# Patient Record
Sex: Male | Born: 2000 | Race: White | Hispanic: No | Marital: Single | Smoking: Never smoker
Health system: Southern US, Community
[De-identification: ages and names within clinical notes are randomized; demographics above are authoritative.]

## PROBLEM LIST (undated history)

## (undated) DIAGNOSIS — N200 Calculus of kidney: Secondary | ICD-10-CM

## (undated) DIAGNOSIS — R109 Unspecified abdominal pain: Secondary | ICD-10-CM

## (undated) DIAGNOSIS — Z87442 Personal history of urinary calculi: Secondary | ICD-10-CM

## (undated) HISTORY — DX: Unspecified abdominal pain: R10.9

## (undated) HISTORY — PX: TONSILLECTOMY: SUR1361

---
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
## 2021-06-22 ENCOUNTER — Other Ambulatory Visit (HOSPITAL_COMMUNITY): Payer: Self-pay | Admitting: Surgery

## 2021-06-28 ENCOUNTER — Other Ambulatory Visit (HOSPITAL_COMMUNITY): Payer: Self-pay | Admitting: Surgery

## 2021-07-03 ENCOUNTER — Ambulatory Visit (HOSPITAL_COMMUNITY)
Admission: RE | Admit: 2021-07-03 | Discharge: 2021-07-03 | Disposition: A | Discharge status: Home / Self Care | Payer: BC Managed Care – PPO | Source: Ambulatory Visit | Attending: Surgery | Admitting: Surgery

## 2021-07-03 ENCOUNTER — Other Ambulatory Visit: Payer: Self-pay

## 2021-07-26 ENCOUNTER — Encounter: Payer: BC Managed Care – PPO | Attending: Surgery | Admitting: Skilled Nursing Facility1

## 2021-07-26 ENCOUNTER — Encounter: Payer: Self-pay | Admitting: Skilled Nursing Facility1

## 2021-07-26 ENCOUNTER — Other Ambulatory Visit: Payer: Self-pay

## 2021-07-26 DIAGNOSIS — E669 Obesity, unspecified: Secondary | ICD-10-CM | POA: Diagnosis not present

## 2021-07-26 NOTE — Progress Notes (Signed)
Nutrition Assessment for Bariatric Surgery Medical Nutrition Therapy Appt Start Time: 8:10    End Time: 9:10  Patient was seen on 07/26/2021 for Pre-Operative Nutrition Assessment. Letter of approval faxed to [facility] bariatric surgery program coordinator on 07/26/2021.   Referral stated Supervised Weight Loss (SWL) visits needed: 0  Pt completed visits.   Pt has cleared nutrition requirements.   Planned surgery: Sleeve Gastrectomy  Pt expectation of surgery: to lose weight Pt expectation of dietitian: none identified     NUTRITION ASSESSMENT   Anthropometrics  Start weight at [facility]: 311.2 lbs (date: 07/26/2021)  Height: 71 in BMI: 43.40 kg/m2     Clinical  Medical hx: Kidney stones Medications: N/A  Labs: A1C 5.4, LDL 133, triglycerides 193, HDL 38 Notable signs/symptoms: N/A Any previous deficiencies? No  Micronutrient Nutrition Focused Physical Exam: Hair: No issues observed Eyes: No issues observed Mouth: No issues observed Neck: No issues observed Nails: No issues observed Skin: No issues observed  Lifestyle & Dietary Hx  Pt states his parents have had the bariatric surgery and believes them to be successful.   Pt states he does not like any non starchy vegetables.   Pt already has baseline really great habits with his foods, a simple change for him is to add in non starchy vegetables and increase his activity on the weekends.  24-Hr Dietary Recall First Meal: granola bar Snack:  Second Meal: ham sandwich Snack: slim jim  Third Meal: chicken and cheese Snack:  Beverages: water, water +flavorings, half and half tea   Estimated Energy Needs Calories: 2000   NUTRITION DIAGNOSIS  Overweight/obesity (Forman-3.3) related to past poor dietary habits and physical inactivity as evidenced by patient w/ planned sleeve gastrectomy surgery following dietary guidelines for continued weight loss.    NUTRITION INTERVENTION  Nutrition counseling (C-1)  and education (E-2) to facilitate bariatric surgery goals.  Educated pt on micronutrient deficiencies post surgery and strategies to mitigate that risk   Pre-Op Goals Reviewed with the Patient Track food and beverage intake (pen and paper, MyFitness Pal, Baritastic app, etc.) Make healthy food choices while monitoring portion sizes Consume 3 meals per day or try to eat every 3-5 hours Avoid concentrated sugars and fried foods Keep sugar & fat in the single digits per serving on food labels Practice CHEWING your food (aim for applesauce consistency) Practice not drinking 15 minutes before, during, and 30 minutes after each meal and snack Avoid all carbonated beverages (ex: soda, sparkling beverages)  Limit caffeinated beverages (ex: coffee, tea, energy drinks) Avoid all sugar-sweetened beverages (ex: regular soda, sports drinks)  Avoid alcohol  Aim for 64-100 ounces of FLUID daily (with at least half of fluid intake being plain water)  Aim for at least 60-80 grams of PROTEIN daily Look for a liquid protein source that contains ?15 g protein and ?5 g carbohydrate (ex: shakes, drinks, shots) Make a list of non-food related activities Physical activity is an important part of a healthy lifestyle so keep it moving! The goal is to reach 150 minutes of exercise per week, including cardiovascular and weight baring activity.  *Goals that are bolded indicate the pt would like to start working towards these  Handouts Provided Include  Bariatric Surgery handouts (Nutrition Visits, Pre-Op Goals, Protein Shakes, Vitamins & Minerals)  Learning Style & Readiness for Change Teaching method utilized: Visual & Auditory  Demonstrated degree of understanding via: Teach Back  Readiness Level: Action Barriers to learning/adherence to lifestyle change: unknown  MONITORING & EVALUATION Dietary intake, weekly physical activity, body weight, and pre-op goals reached at next nutrition visit.    Next  Steps  Patient is to follow up at [facility] for Pre-Op Class >2 weeks before surgery for further nutrition education.  Pt has completed visits. No further supervised visits required/recomended

## 2021-09-03 ENCOUNTER — Encounter: Payer: BC Managed Care – PPO | Attending: Surgery | Admitting: Skilled Nursing Facility1

## 2021-09-03 DIAGNOSIS — E669 Obesity, unspecified: Secondary | ICD-10-CM | POA: Insufficient documentation

## 2021-09-10 ENCOUNTER — Encounter: Payer: BC Managed Care – PPO | Admitting: Skilled Nursing Facility1

## 2021-09-10 ENCOUNTER — Other Ambulatory Visit: Payer: Self-pay

## 2021-09-10 DIAGNOSIS — E669 Obesity, unspecified: Secondary | ICD-10-CM

## 2021-09-10 NOTE — Progress Notes (Signed)
Pre-Operative Nutrition Class:    Patient was seen on 09/10/2021 for Pre-Operative Bariatric Surgery Education [facility]    Surgery date: 10/16/2021 Surgery type: sleeve Start weight at [facility]: 311 Weight today: 309  Samples given per MNT protocol. Patient educated on appropriate usage:  Ensure max exp: January 16, 2022 Ensure max lot: [id number]  Chewable bariatric advantage: advanced multi EA exp: 08/23 Chewable bariatric advantage: advanced multi EA lot: [id number]  Bariatric advantage calcium citrate exp: 02/23 Bariatric advantage calcium citrate lot: [id number]    The following the learning objectives were met by the patient during this course: Identify Pre-Op Dietary Goals and will begin 2 weeks pre-operatively Identify appropriate sources of fluids and proteins  State protein recommendations and appropriate sources pre and post-operatively Identify Post-Operative Dietary Goals and will follow for 2 weeks post-operatively Identify appropriate multivitamin and calcium sources Describe the need for physical activity post-operatively and will follow MD recommendations State when to call healthcare provider regarding medication questions or post-operative complications When having a diagnosis of diabetes understanding hypoglycemia symptoms and the inclusion of 1 complex carbohydrate per meal  Handouts given during class include: Pre-Op Bariatric Surgery Diet Handout Protein Shake Handout Post-Op Bariatric Surgery Nutrition Handout BELT Program Information Flyer Support Group Information Flyer [facility] Bariatric Supplements Price List  Follow-Up Plan: Patient will follow-up at [facility] 2 weeks post operatively for diet advancement per MD.

## 2021-10-03 ENCOUNTER — Other Ambulatory Visit (HOSPITAL_COMMUNITY): Payer: Self-pay

## 2021-10-05 NOTE — Patient Instructions (Signed)
DUE TO COVID-19 ONLY ONE VISITOR IS ALLOWED TO COME WITH YOU AND STAY IN THE WAITING ROOM ONLY DURING PRE OP AND PROCEDURE DAY OF SURGERY IF YOU ARE GOING HOME AFTER SURGERY. IF YOU ARE SPENDING THE NIGHT 2 PEOPLE MAY VISIT WITH YOU IN YOUR PRIVATE ROOM AFTER SURGERY UNTIL VISITING  HOURS ARE OVER AT 8:00 PM AND 1  VISITOR  MAY  SPEND THE NIGHT.                 Daniel Gregory     Your procedure is scheduled on: 10/16/21   Report to [facility] Main  Entrance   Report to admitting at  11:30 AM     Call this number if you have problems the morning of surgery [phone]   NO SOLID FOOD AFTER 6:00 PM THE NIGHT BEFORE YOUR SURGERY.   YOU MAY DRINK CLEAR FLUIDS UNTIL 11:00 AM  THE G2 DRINK YOU DRINK BEFORE YOU LEAVE HOME WILL BE THE LAST FLUIDS YOU DRINK BEFORE SURGERY.   PAIN IS EXPECTED AFTER SURGERY AND WILL NOT BE COMPLETELY ELIMINATED.   AMBULATION AND TYLENOL WILL HELP REDUCE INCISIONAL AND GAS PAIN. MOVEMENT IS KEY!  YOU ARE EXPECTED TO BE OUT OF BED WITHIN 4 HOURS OF ADMISSION TO YOUR PATIENT ROOM.  SITTING IN THE RECLINER THROUGHOUT THE DAY IS IMPORTANT FOR DRINKING FLUIDS AND MOVING GAS THROUGHOUT THE GI TRACT.  COMPRESSION STOCKINGS SHOULD BE WORN [facility] STAY UNLESS YOU ARE WALKING.   INCENTIVE SPIROMETER SHOULD BE USED EVERY HOUR WHILE AWAKE TO DECREASE POST-OPERATIVE COMPLICATIONS SUCH AS PNEUMONIA.  WHEN DISCHARGED HOME, IT IS IMPORTANT TO CONTINUE TO WALK EVERY HOUR AND USE THE INCENTIVE SPIROMETER EVERY HOUR.    CLEAR LIQUID DIET   Foods Allowed                                                                     Foods Excluded  Coffee and tea, regular and decaf                             liquids that you cannot  Plain Jell-O any favor except red or purple                                           see through such as: Fruit ices (not with fruit pulp)                                     milk, soups, orange juice  Iced Popsicles                                     All solid food Carbonated beverages, regular and diet                                    Cranberry, grape and apple  juices Sports drinks like Gatorade Lightly seasoned clear broth or consume(fat free) Sugar    BRUSH YOUR TEETH MORNING OF SURGERY AND RINSE YOUR MOUTH OUT, NO CHEWING GUM CANDY OR MINTS.     Take these medicines the morning of surgery with A SIP OF WATER: none                                You may not have any metal on your body including              piercings  Do not wear jewelry,lotions, powders or  deodorant             Men may shave face and neck.   Do not bring valuables to the hospital. [facility] IS NOT             RESPONSIBLE   FOR VALUABLES.  Contacts, dentures or bridgework may not be worn into surgery.  Leave suitcase in the car. After surgery it may be brought to your room.                [place] [place] - Preparing for Surgery Before surgery, you can play an important role.  Because skin is not sterile, your skin needs to be as free of germs as possible.  You can reduce the number of germs on your skin by washing with CHG (chlorahexidine gluconate) soap before surgery.  CHG is an antiseptic cleaner which kills germs and bonds with the skin to continue killing germs even after washing. Please DO NOT use if you have an allergy to CHG or antibacterial soaps.  If your skin becomes reddened/irritated stop using the CHG and inform your nurse when you arrive at Short Stay..  You may shave your face/neck. Please follow these instructions carefully:  1.  Shower with CHG Soap the night before surgery and the  morning of Surgery.  2.  If you choose to wash your hair, wash your hair first as usual with your  normal  shampoo.  3.  After you shampoo, rinse your hair and body thoroughly to remove the  shampoo.                            4.  Use CHG as you would any other liquid soap.  You can apply chg directly  to the skin and wash                        Gently with a scrungie or clean washcloth.  5.  Apply the CHG Soap to your body ONLY FROM THE NECK DOWN.   Do not use on face/ open                           Wound or open sores. Avoid contact with eyes, ears mouth and genitals (private parts).                       Wash face,  Genitals (private parts) with your normal soap.             6.  Wash thoroughly, paying special attention to the area where your surgery  will be performed.  7.  Thoroughly rinse your body with warm water from the neck down.  8.  DO NOT shower/wash  with your normal soap after using and rinsing off  the CHG Soap.                9.  Pat yourself dry with a clean towel.            10.  Wear clean pajamas.            11.  Place clean sheets on your bed the night of your first shower and do not  sleep with pets. Day of Surgery : Do not apply any lotions/deodorants the morning of surgery.  Please wear clean clothes to the hospital/surgery center.  FAILURE TO FOLLOW THESE INSTRUCTIONS MAY RESULT IN THE CANCELLATION OF YOUR SURGERY PATIENT SIGNATURE_________________________________  NURSE SIGNATURE__________________________________  ________________________________________________________________________   Daniel Gregory  An incentive spirometer is a tool that can help keep your lungs clear and active. This tool measures how well you are filling your lungs with each breath. Taking long deep breaths may help reverse or decrease the chance of developing breathing (pulmonary) problems (especially infection) following: A long period of time when you are unable to move or be active. BEFORE THE PROCEDURE  If the spirometer includes an indicator to show your best effort, your nurse or respiratory therapist will set it to a desired goal. If possible, sit up straight or lean slightly forward. Try not to slouch. Hold the incentive spirometer in an upright position. INSTRUCTIONS FOR USE  Sit on the edge of your bed  if possible, or sit up as far as you can in bed or on a chair. Hold the incentive spirometer in an upright position. Breathe out normally. Place the mouthpiece in your mouth and seal your lips tightly around it. Breathe in slowly and as deeply as possible, raising the piston or the ball toward the top of the column. Hold your breath for 3-5 seconds or for as long as possible. Allow the piston or ball to fall to the bottom of the column. Remove the mouthpiece from your mouth and breathe out normally. Rest for a few seconds and repeat Steps 1 through 7 at least 10 times every 1-2 hours when you are awake. Take your time and take a few normal breaths between deep breaths. The spirometer may include an indicator to show your best effort. Use the indicator as a goal to work toward during each repetition. After each set of 10 deep breaths, practice coughing to be sure your lungs are clear. If you have an incision (the cut made at the time of surgery), support your incision when coughing by placing a pillow or rolled up towels firmly against it. Once you are able to get out of bed, walk around indoors and cough well. You may stop using the incentive spirometer when instructed by your caregiver.  RISKS AND COMPLICATIONS Take your time so you do not get dizzy or light-headed. If you are in pain, you may need to take or ask for pain medication before doing incentive spirometry. It is harder to take a deep breath if you are having pain. AFTER USE Rest and breathe slowly and easily. It can be helpful to keep track of a log of your progress. Your caregiver can provide you with a simple table to help with this. If you are using the spirometer at home, follow these instructions: SEEK MEDICAL CARE IF:  You are having difficultly using the spirometer. You have trouble using the spirometer as often as instructed. Your pain medication is not giving enough relief  while using the spirometer. You develop fever of  100.5 F (38.1 C) or higher. SEEK IMMEDIATE MEDICAL CARE IF:  You cough up bloody sputum that had not been present before. You develop fever of 102 F (38.9 C) or greater. You develop worsening pain at or near the incision site. MAKE SURE YOU:  Understand these instructions. Will watch your condition. Will get help right away if you are not doing well or get worse. Document Released: 03/17/2007 Document Revised: 01/27/2012 Document Reviewed: 05/18/2007 [facility] Patient Information 2014 [place], [place].   ________________________________________________________________________

## 2021-10-08 ENCOUNTER — Encounter (HOSPITAL_COMMUNITY)
Admission: RE | Admit: 2021-10-08 | Discharge: 2021-10-08 | Disposition: A | Discharge status: Home / Self Care | Payer: BC Managed Care – PPO | Source: Ambulatory Visit | Attending: Surgery | Admitting: Surgery

## 2021-10-08 ENCOUNTER — Encounter (HOSPITAL_COMMUNITY): Payer: Self-pay

## 2021-10-08 ENCOUNTER — Other Ambulatory Visit: Payer: Self-pay

## 2021-10-08 DIAGNOSIS — Z6841 Body Mass Index (BMI) 40.0 and over, adult: Secondary | ICD-10-CM | POA: Insufficient documentation

## 2021-10-08 DIAGNOSIS — Z01812 Encounter for preprocedural laboratory examination: Secondary | ICD-10-CM | POA: Insufficient documentation

## 2021-10-08 HISTORY — DX: Personal history of urinary calculi: Z87.442

## 2021-10-08 LAB — CBC WITH DIFFERENTIAL/PLATELET
Abs Immature Granulocytes: 0.02 10*3/uL (ref 0.00–0.07)
Basophils Absolute: 0 10*3/uL (ref 0.0–0.1)
Basophils Relative: 1 %
Eosinophils Absolute: 0.1 10*3/uL (ref 0.0–0.5)
Eosinophils Relative: 1 %
HCT: 46.6 % (ref 39.0–52.0)
Hemoglobin: 15.6 g/dL (ref 13.0–17.0)
Immature Granulocytes: 0 %
Lymphocytes Relative: 25 %
Lymphs Abs: 1.9 10*3/uL (ref 0.7–4.0)
MCH: 29.1 pg (ref 26.0–34.0)
MCHC: 33.5 g/dL (ref 30.0–36.0)
MCV: 86.8 fL (ref 80.0–100.0)
Monocytes Absolute: 0.7 10*3/uL (ref 0.1–1.0)
Monocytes Relative: 9 %
Neutro Abs: 4.8 10*3/uL (ref 1.7–7.7)
Neutrophils Relative %: 64 %
Platelets: 243 10*3/uL (ref 150–400)
RBC: 5.37 MIL/uL (ref 4.22–5.81)
RDW: 12.6 % (ref 11.5–15.5)
WBC: 7.5 10*3/uL (ref 4.0–10.5)
nRBC: 0 % (ref 0.0–0.2)

## 2021-10-08 LAB — COMPREHENSIVE METABOLIC PANEL
ALT: 24 U/L (ref 0–44)
AST: 24 U/L (ref 15–41)
Albumin: 4.7 g/dL (ref 3.5–5.0)
Alkaline Phosphatase: 60 U/L (ref 38–126)
Anion gap: 8 (ref 5–15)
BUN: 14 mg/dL (ref 6–20)
CO2: 27 mmol/L (ref 22–32)
Calcium: 9.6 mg/dL (ref 8.9–10.3)
Chloride: 103 mmol/L (ref 98–111)
Creatinine, Ser: 0.85 mg/dL (ref 0.61–1.24)
GFR, Estimated: >60 mL/min (ref >60)
Glucose, Bld: 95 mg/dL (ref 70–99)
Potassium: 4.1 mmol/L (ref 3.5–5.1)
Sodium: 138 mmol/L (ref 135–145)
Total Bilirubin: 1.5 mg/dL — ABNORMAL HIGH (ref 0.3–1.2)
Total Protein: 7.8 g/dL (ref 6.5–8.1)

## 2021-10-08 NOTE — Progress Notes (Signed)
COVID test- DOS   PCP - Dr. Rosalita Levan Cardiologist - none  Chest x-ray - 07/03/21-epic EKG - 07/03/21-epic Stress Test - no ECHO - no Cardiac Cath - no Pacemaker/ICD device last checked:NA  Sleep Study - no CPAP -   Fasting Blood Sugar - NA Checks Blood Sugar _____ times a day  Blood Thinner Instructions:NA Aspirin Instructions: Last Dose:  Anesthesia review: no  Patient denies shortness of breath, fever, cough and chest pain at PAT appointment Pt reports no SOB with activities. Pt had MRSA in his Lt knee in 2021  Patient verbalized understanding of instructions that were given to them at the PAT appointment. Patient was also instructed that they will need to review over the PAT instructions again at home before surgery. yes

## 2021-10-13 NOTE — H&P (Signed)
16 October 2021  Chief Complaint: Pre-op Exam (Sleeve)   History of Present Illness: Daniel Gregory is a 20 y.o. male who is seen today for preop for his sleeve gastrectomy scheduled 11/29. His BMI today is 44. I discussed the preop diet 2 weeks beforehand. He seems to be well aware of this. Nothing is happened in terms of his physical exam since last time I saw him..  Review of Systems: See HPI as well for other ROS.  ROS   Medical History: History reviewed. No pertinent past medical history.  Patient Active Problem List  Diagnosis   Morbid obesity with BMI of 40.0-44.9, adult (CMS-HCC)   History reviewed. No pertinent surgical history.   No Known Allergies  No current outpatient medications on file prior to visit.   No current facility-administered medications on file prior to visit.   Family History  Problem Relation Age of Onset   Obesity Mother   Obesity Father   Obesity Sister    Social History   Tobacco Use  Smoking Status Never Smoker  Smokeless Tobacco Never Used    Social History   Socioeconomic History   Marital status: Single  Tobacco Use   Smoking status: Never Smoker   Smokeless tobacco: Never Used  Substance and Sexual Activity   Alcohol use: Not Currently   Drug use: Never   Objective:   Vitals:  08/29/21 1330  Weight: (!) 141.7 kg (312 lb 6.4 oz)  Height: 177.8 cm (5\' 10" )   Body mass index is 44.82 kg/m.  Physical Exam General: Obese white male BMI 44 HEENT: Unremarkable Chest : Clear Heart: Sinus rhythm Breast: Not examined Abdomen: Obese and nontender GU not examined Rectal not performed Extremities full range of motion Neuro alert and oriented x3. Motor and sensory function grossly intact  Labs, Imaging and Diagnostic Testing:  Upper GI series read as normal  Assessment and Plan:  Diagnoses and all orders for this visit:  Morbid (severe) obesity due to excess calories (CMS-HCC)    For robotic assisted sleeve  gastrectomy on November 29. I have placed his orders in epic  No follow-ups on file.  Niomie Englert December 01, MD

## 2021-10-16 ENCOUNTER — Inpatient Hospital Stay (HOSPITAL_COMMUNITY): Payer: BC Managed Care – PPO | Admitting: Certified Registered"

## 2021-10-16 ENCOUNTER — Inpatient Hospital Stay (HOSPITAL_COMMUNITY)
Admission: RE | Admit: 2021-10-16 | Discharge: 2021-10-17 | DRG: 621 | Disposition: A | Discharge status: Home / Self Care | Payer: BC Managed Care – PPO | Attending: Surgery | Admitting: Surgery

## 2021-10-16 ENCOUNTER — Encounter (HOSPITAL_COMMUNITY): Payer: Self-pay | Admitting: Surgery

## 2021-10-16 ENCOUNTER — Encounter (HOSPITAL_COMMUNITY)
Admission: RE | Disposition: A | Discharge status: Home / Self Care | Payer: Self-pay | Source: Home / Self Care | Attending: Surgery

## 2021-10-16 DIAGNOSIS — Z6841 Body Mass Index (BMI) 40.0 and over, adult: Secondary | ICD-10-CM | POA: Diagnosis not present

## 2021-10-16 DIAGNOSIS — Z01818 Encounter for other preprocedural examination: Secondary | ICD-10-CM

## 2021-10-16 DIAGNOSIS — Z9884 Bariatric surgery status: Secondary | ICD-10-CM

## 2021-10-16 DIAGNOSIS — Z20822 Contact with and (suspected) exposure to covid-19: Secondary | ICD-10-CM | POA: Diagnosis present

## 2021-10-16 HISTORY — PX: UPPER GI ENDOSCOPY: SHX6162

## 2021-10-16 LAB — CBC
HCT: 45.4 % (ref 39.0–52.0)
Hemoglobin: 15.2 g/dL (ref 13.0–17.0)
MCH: 29.4 pg (ref 26.0–34.0)
MCHC: 33.5 g/dL (ref 30.0–36.0)
MCV: 87.8 fL (ref 80.0–100.0)
Platelets: 265 10*3/uL (ref 150–400)
RBC: 5.17 MIL/uL (ref 4.22–5.81)
RDW: 12.4 % (ref 11.5–15.5)
WBC: 14.7 10*3/uL — ABNORMAL HIGH (ref 4.0–10.5)
nRBC: 0 % (ref 0.0–0.2)

## 2021-10-16 LAB — ABO/RH: ABO/RH(D): A POS

## 2021-10-16 LAB — TYPE AND SCREEN
ABO/RH(D): A POS
Antibody Screen: NEGATIVE

## 2021-10-16 LAB — SARS CORONAVIRUS 2 BY RT PCR (HOSPITAL ORDER, PERFORMED IN ~~LOC~~ HOSPITAL LAB): SARS Coronavirus 2: NEGATIVE

## 2021-10-16 LAB — CREATININE, SERUM
Creatinine, Ser: 0.98 mg/dL (ref 0.61–1.24)
GFR, Estimated: >60 mL/min (ref >60)

## 2021-10-16 LAB — HEMOGLOBIN AND HEMATOCRIT, BLOOD
HCT: 46.7 % (ref 39.0–52.0)
Hemoglobin: 15.7 g/dL (ref 13.0–17.0)

## 2021-10-16 SURGERY — XI ROBOTIC GASTRIC SLEEVE RESECTION
Anesthesia: General | Site: Esophagus

## 2021-10-16 MED ORDER — ROCURONIUM BROMIDE 10 MG/ML (PF) SYRINGE
PREFILLED_SYRINGE | INTRAVENOUS | Status: DC | PRN
  Administered 2021-10-16: 20 mg via INTRAVENOUS
  Administered 2021-10-16: 50 mg via INTRAVENOUS
  Administered 2021-10-16: 20 mg via INTRAVENOUS

## 2021-10-16 MED ORDER — KETAMINE HCL 10 MG/ML IJ SOLN
INTRAMUSCULAR | Status: AC
  Filled 2021-10-16: qty 1

## 2021-10-16 MED ORDER — OXYCODONE HCL 5 MG/5ML PO SOLN
5.0000 mg | Freq: Four times a day (QID) | ORAL | Status: DC | PRN
  Administered 2021-10-16 – 2021-10-17 (×2): 5 mg via ORAL
  Filled 2021-10-16: qty 5

## 2021-10-16 MED ORDER — PROPOFOL 10 MG/ML IV BOLUS
INTRAVENOUS | Status: AC
  Filled 2021-10-16: qty 20

## 2021-10-16 MED ORDER — ACETAMINOPHEN 500 MG PO TABS
1000.0000 mg | ORAL_TABLET | Freq: Three times a day (TID) | ORAL | Status: DC
  Administered 2021-10-16 – 2021-10-17 (×2): 1000 mg via ORAL
  Filled 2021-10-16: qty 2

## 2021-10-16 MED ORDER — FENTANYL CITRATE PF 50 MCG/ML IJ SOSY
PREFILLED_SYRINGE | INTRAMUSCULAR | Status: AC
  Filled 2021-10-16: qty 2

## 2021-10-16 MED ORDER — MORPHINE SULFATE (PF) 2 MG/ML IV SOLN
1.0000 mg | INTRAVENOUS | Status: DC | PRN
  Administered 2021-10-16 (×2): 2 mg via INTRAVENOUS

## 2021-10-16 MED ORDER — SODIUM CHLORIDE 0.9 % IV SOLN
INTRAVENOUS | Status: AC
  Filled 2021-10-16: qty 2

## 2021-10-16 MED ORDER — LACTATED RINGERS IR SOLN
Status: DC | PRN
  Administered 2021-10-16: 1000 mL

## 2021-10-16 MED ORDER — FENTANYL CITRATE PF 50 MCG/ML IJ SOSY
25.0000 ug | PREFILLED_SYRINGE | INTRAMUSCULAR | Status: DC | PRN
  Administered 2021-10-16 (×2): 50 ug via INTRAVENOUS

## 2021-10-16 MED ORDER — ENSURE MAX PROTEIN PO LIQD
2.0000 [oz_av] | ORAL | Status: DC
  Administered 2021-10-17 (×3): 2 [oz_av] via ORAL

## 2021-10-16 MED ORDER — ACETAMINOPHEN 160 MG/5ML PO SOLN: 1000.0000 mg | Freq: Three times a day (TID) | ORAL | Status: DC

## 2021-10-16 MED ORDER — ACETAMINOPHEN 500 MG PO TABS
ORAL_TABLET | ORAL | Status: AC
  Filled 2021-10-16: qty 2

## 2021-10-16 MED ORDER — DEXAMETHASONE SODIUM PHOSPHATE 10 MG/ML IJ SOLN
INTRAMUSCULAR | Status: DC | PRN
  Administered 2021-10-16: 8 mg via INTRAVENOUS

## 2021-10-16 MED ORDER — PROSOURCE PLUS PO LIQD
ORAL | Status: AC
  Filled 2021-10-16: qty 30

## 2021-10-16 MED ORDER — CHLORHEXIDINE GLUCONATE CLOTH 2 % EX PADS: 6.0000 | MEDICATED_PAD | Freq: Once | CUTANEOUS | Status: DC

## 2021-10-16 MED ORDER — SUGAMMADEX SODIUM 500 MG/5ML IV SOLN
INTRAVENOUS | Status: AC
  Filled 2021-10-16: qty 5

## 2021-10-16 MED ORDER — ONDANSETRON HCL 4 MG/2ML IJ SOLN
4.0000 mg | INTRAMUSCULAR | Status: DC | PRN
  Administered 2021-10-16: 4 mg via INTRAVENOUS

## 2021-10-16 MED ORDER — HEPARIN SODIUM (PORCINE) 5000 UNIT/ML IJ SOLN
5000.0000 [IU] | Freq: Three times a day (TID) | INTRAMUSCULAR | Status: DC
  Administered 2021-10-16 – 2021-10-17 (×4): 5000 [IU] via SUBCUTANEOUS
  Filled 2021-10-16 (×2): qty 1

## 2021-10-16 MED ORDER — ENOXAPARIN (LOVENOX) PATIENT EDUCATION KIT
PACK | Freq: Once | Status: AC
  Filled 2021-10-16: qty 1

## 2021-10-16 MED ORDER — KCL IN DEXTROSE-NACL 20-5-0.45 MEQ/L-%-% IV SOLN: INTRAVENOUS | Status: DC

## 2021-10-16 MED ORDER — LACTATED RINGERS IV SOLN: INTRAVENOUS | Status: DC

## 2021-10-16 MED ORDER — LIDOCAINE HCL 2 % IJ SOLN
INTRAMUSCULAR | Status: AC
  Filled 2021-10-16: qty 20

## 2021-10-16 MED ORDER — APREPITANT 40 MG PO CAPS
ORAL_CAPSULE | ORAL | Status: AC
  Filled 2021-10-16: qty 1

## 2021-10-16 MED ORDER — STERILE WATER FOR IRRIGATION IR SOLN
Status: DC | PRN
  Administered 2021-10-16: 2000 mL

## 2021-10-16 MED ORDER — BUPIVACAINE-EPINEPHRINE (PF) 0.25% -1:200000 IJ SOLN
INTRAMUSCULAR | Status: AC
  Filled 2021-10-16: qty 30

## 2021-10-16 MED ORDER — ACETAMINOPHEN 500 MG PO TABS
1000.0000 mg | ORAL_TABLET | ORAL | Status: AC
  Administered 2021-10-16: 1000 mg via ORAL

## 2021-10-16 MED ORDER — APREPITANT 40 MG PO CAPS
40.0000 mg | ORAL_CAPSULE | ORAL | Status: AC
  Administered 2021-10-16: 40 mg via ORAL

## 2021-10-16 MED ORDER — SODIUM CHLORIDE (PF) 0.9 % IJ SOLN
INTRAMUSCULAR | Status: DC | PRN
  Administered 2021-10-16: 10 mL

## 2021-10-16 MED ORDER — FENTANYL CITRATE (PF) 100 MCG/2ML IJ SOLN
INTRAMUSCULAR | Status: DC | PRN
  Administered 2021-10-16: 100 ug via INTRAVENOUS

## 2021-10-16 MED ORDER — KETAMINE HCL 10 MG/ML IJ SOLN
INTRAMUSCULAR | Status: DC | PRN
  Administered 2021-10-16 (×2): 15 mg via INTRAVENOUS

## 2021-10-16 MED ORDER — FENTANYL CITRATE (PF) 100 MCG/2ML IJ SOLN
INTRAMUSCULAR | Status: AC
  Filled 2021-10-16: qty 2

## 2021-10-16 MED ORDER — MIDAZOLAM HCL 2 MG/2ML IJ SOLN
INTRAMUSCULAR | Status: AC
  Filled 2021-10-16: qty 2

## 2021-10-16 MED ORDER — PROPOFOL 10 MG/ML IV BOLUS
INTRAVENOUS | Status: DC | PRN
  Administered 2021-10-16: 200 mg via INTRAVENOUS

## 2021-10-16 MED ORDER — SIMETHICONE 80 MG PO CHEW
80.0000 mg | CHEWABLE_TABLET | Freq: Four times a day (QID) | ORAL | Status: DC | PRN
  Administered 2021-10-16: 80 mg via ORAL
  Filled 2021-10-16: qty 1

## 2021-10-16 MED ORDER — SCOPOLAMINE 1 MG/3DAYS TD PT72
1.0000 | MEDICATED_PATCH | TRANSDERMAL | Status: DC
  Administered 2021-10-16: 1.5 mg via TRANSDERMAL

## 2021-10-16 MED ORDER — METOPROLOL TARTRATE 5 MG/5ML IV SOLN
5.0000 mg | Freq: Four times a day (QID) | INTRAVENOUS | Status: DC | PRN
  Administered 2021-10-16: 5 mg via INTRAVENOUS
  Filled 2021-10-16: qty 5

## 2021-10-16 MED ORDER — HEPARIN SODIUM (PORCINE) 5000 UNIT/ML IJ SOLN
INTRAMUSCULAR | Status: AC
  Filled 2021-10-16: qty 1

## 2021-10-16 MED ORDER — MIRTAZAPINE 15 MG PO TABS
ORAL_TABLET | ORAL | Status: AC
  Filled 2021-10-16: qty 1

## 2021-10-16 MED ORDER — BUPIVACAINE LIPOSOME 1.3 % IJ SUSP
INTRAMUSCULAR | Status: DC | PRN
  Administered 2021-10-16: 20 mL

## 2021-10-16 MED ORDER — LIDOCAINE 2% (20 MG/ML) 5 ML SYRINGE
INTRAMUSCULAR | Status: DC | PRN
  Administered 2021-10-16: 100 mg via INTRAVENOUS

## 2021-10-16 MED ORDER — MIDAZOLAM HCL 2 MG/2ML IJ SOLN
INTRAMUSCULAR | Status: DC | PRN
  Administered 2021-10-16: 2 mg via INTRAVENOUS

## 2021-10-16 MED ORDER — PANTOPRAZOLE SODIUM 40 MG IV SOLR
40.0000 mg | Freq: Every day | INTRAVENOUS | Status: DC
  Administered 2021-10-16: 40 mg via INTRAVENOUS

## 2021-10-16 MED ORDER — MORPHINE SULFATE (PF) 2 MG/ML IV SOLN
INTRAVENOUS | Status: AC
  Filled 2021-10-16: qty 1

## 2021-10-16 MED ORDER — 0.9 % SODIUM CHLORIDE (POUR BTL) OPTIME
TOPICAL | Status: DC | PRN
  Administered 2021-10-16: 1000 mL

## 2021-10-16 MED ORDER — SUGAMMADEX SODIUM 500 MG/5ML IV SOLN
INTRAVENOUS | Status: DC | PRN
  Administered 2021-10-16: 280 mg via INTRAVENOUS

## 2021-10-16 MED ORDER — CHLORHEXIDINE GLUCONATE 0.12 % MT SOLN: 15.0000 mL | Freq: Once | OROMUCOSAL | Status: AC

## 2021-10-16 MED ORDER — BUPIVACAINE LIPOSOME 1.3 % IJ SUSP
INTRAMUSCULAR | Status: AC
  Filled 2021-10-16: qty 20

## 2021-10-16 MED ORDER — LIDOCAINE HCL (PF) 2 % IJ SOLN
INTRAMUSCULAR | Status: DC | PRN
  Administered 2021-10-16: 1.5 mg/kg/h via INTRADERMAL

## 2021-10-16 MED ORDER — SUCCINYLCHOLINE CHLORIDE 200 MG/10ML IV SOSY
PREFILLED_SYRINGE | INTRAVENOUS | Status: DC | PRN
  Administered 2021-10-16: 140 mg via INTRAVENOUS

## 2021-10-16 MED ORDER — HEPARIN SODIUM (PORCINE) 5000 UNIT/ML IJ SOLN
5000.0000 [IU] | INTRAMUSCULAR | Status: AC
  Administered 2021-10-16: 5000 [IU] via SUBCUTANEOUS

## 2021-10-16 MED ORDER — ONDANSETRON HCL 4 MG/2ML IJ SOLN
INTRAMUSCULAR | Status: DC | PRN
  Administered 2021-10-16: 4 mg via INTRAVENOUS

## 2021-10-16 MED ORDER — SCOPOLAMINE 1 MG/3DAYS TD PT72
MEDICATED_PATCH | TRANSDERMAL | Status: AC
  Filled 2021-10-16: qty 1

## 2021-10-16 MED ORDER — SODIUM CHLORIDE 0.9 % IV SOLN
2.0000 g | INTRAVENOUS | Status: AC
  Administered 2021-10-16: 2 g via INTRAVENOUS

## 2021-10-16 MED ORDER — PHENYLEPHRINE 40 MCG/ML (10ML) SYRINGE FOR IV PUSH (FOR BLOOD PRESSURE SUPPORT)
PREFILLED_SYRINGE | INTRAVENOUS | Status: AC
  Filled 2021-10-16: qty 10

## 2021-10-16 MED ORDER — BUPIVACAINE LIPOSOME 1.3 % IJ SUSP: 20.0000 mL | Freq: Once | INTRAMUSCULAR | Status: DC

## 2021-10-16 MED ORDER — ORAL CARE MOUTH RINSE
15.0000 mL | Freq: Once | OROMUCOSAL | Status: AC
  Administered 2021-10-16: 15 mL via OROMUCOSAL

## 2021-10-16 MED ORDER — PANTOPRAZOLE SODIUM 40 MG IV SOLR
INTRAVENOUS | Status: AC
  Filled 2021-10-16: qty 40

## 2021-10-16 MED ORDER — PHENYLEPHRINE 40 MCG/ML (10ML) SYRINGE FOR IV PUSH (FOR BLOOD PRESSURE SUPPORT)
PREFILLED_SYRINGE | INTRAVENOUS | Status: DC | PRN
  Administered 2021-10-16: 120 ug via INTRAVENOUS
  Administered 2021-10-16: 160 ug via INTRAVENOUS

## 2021-10-16 SURGICAL SUPPLY — 64 items
APPLIER CLIP 5 13 M/L LIGAMAX5 (MISCELLANEOUS)
APPLIER CLIP ROT 10 11.4 M/L (STAPLE)
BLADE SURG 15 STRL LF DISP TIS (BLADE) ×2 IMPLANT
BLADE SURG 15 STRL SS (BLADE) ×1
CANNULA REDUC XI 12-8 STAPL (CANNULA) ×1
CANNULA REDUCER 12-8 DVNC XI (CANNULA) ×2 IMPLANT
CHLORAPREP W/TINT 26 (MISCELLANEOUS) ×3 IMPLANT
CLIP APPLIE 5 13 M/L LIGAMAX5 (MISCELLANEOUS) IMPLANT
CLIP APPLIE ROT 10 11.4 M/L (STAPLE) IMPLANT
COVER SURGICAL LIGHT HANDLE (MISCELLANEOUS) ×3 IMPLANT
DECANTER SPIKE VIAL GLASS SM (MISCELLANEOUS) ×3 IMPLANT
DERMABOND ADVANCED (GAUZE/BANDAGES/DRESSINGS) ×1
DERMABOND ADVANCED .7 DNX12 (GAUZE/BANDAGES/DRESSINGS) ×2 IMPLANT
DRAPE ARM DVNC X/XI (DISPOSABLE) ×8 IMPLANT
DRAPE COLUMN DVNC XI (DISPOSABLE) ×2 IMPLANT
DRAPE DA VINCI XI ARM (DISPOSABLE) ×4
DRAPE DA VINCI XI COLUMN (DISPOSABLE) ×1
ELECT REM PT RETURN 15FT ADLT (MISCELLANEOUS) ×3 IMPLANT
GLOVE SURG ENC MOIS LTX SZ8 (GLOVE) ×6 IMPLANT
GOWN STRL REUS W/TWL XL LVL3 (GOWN DISPOSABLE) ×9 IMPLANT
GRASPER SUT TROCAR 14GX15 (MISCELLANEOUS) ×3 IMPLANT
IRRIG SUCT STRYKERFLOW 2 WTIP (MISCELLANEOUS) ×3
IRRIGATION SUCT STRKRFLW 2 WTP (MISCELLANEOUS) ×2 IMPLANT
KIT BASIN OR (CUSTOM PROCEDURE TRAY) ×3 IMPLANT
KIT TURNOVER KIT A (KITS) IMPLANT
LUBRICANT JELLY K Y 4OZ (MISCELLANEOUS) IMPLANT
MARKER SKIN DUAL TIP RULER LAB (MISCELLANEOUS) ×3 IMPLANT
MAT PREVALON FULL STRYKER (MISCELLANEOUS) ×3 IMPLANT
NEEDLE SPNL 22GX3.5 QUINCKE BK (NEEDLE) ×3 IMPLANT
OBTURATOR OPTICAL STANDARD 8MM (TROCAR) ×1
OBTURATOR OPTICAL STND 8 DVNC (TROCAR) ×2
OBTURATOR OPTICALSTD 8 DVNC (TROCAR) ×2 IMPLANT
PACK CARDIOVASCULAR III (CUSTOM PROCEDURE TRAY) ×3 IMPLANT
RELOAD STAPLER 2.5X60 WHT DVNC (STAPLE) ×8 IMPLANT
RELOAD STAPLER 3.5X60 BLU DVNC (STAPLE) ×4 IMPLANT
SCISSORS LAP 5X35 DISP (ENDOMECHANICALS) IMPLANT
SEAL CANN UNIV 5-8 DVNC XI (MISCELLANEOUS) ×6 IMPLANT
SEAL XI 5MM-8MM UNIVERSAL (MISCELLANEOUS) ×3
SEALER VESSEL DA VINCI XI (MISCELLANEOUS) ×1
SEALER VESSEL EXT DVNC XI (MISCELLANEOUS) ×2 IMPLANT
SLEEVE GASTRECTOMY 36FR VISIGI (MISCELLANEOUS) ×3 IMPLANT
SOL ANTI FOG 6CC (MISCELLANEOUS) ×2 IMPLANT
SOLUTION ANTI FOG 6CC (MISCELLANEOUS) ×1
SOLUTION ELECTROLUBE (MISCELLANEOUS) ×3 IMPLANT
SPONGE T-LAP 18X18 ~~LOC~~+RFID (SPONGE) ×3 IMPLANT
STAPLER 60 DA VINCI SURE FORM (STAPLE) ×1
STAPLER 60 SUREFORM DVNC (STAPLE) ×2 IMPLANT
STAPLER CANNULA SEAL DVNC XI (STAPLE) ×2 IMPLANT
STAPLER CANNULA SEAL XI (STAPLE) ×1
STAPLER RELOAD 2.5X60 WHITE (STAPLE) ×4
STAPLER RELOAD 2.5X60 WHT DVNC (STAPLE) ×8
STAPLER RELOAD 3.5X60 BLU DVNC (STAPLE) ×4
STAPLER RELOAD 3.5X60 BLUE (STAPLE) ×2
SUT ETHIBOND 0 36 GRN (SUTURE) IMPLANT
SUT MNCRL AB 4-0 PS2 18 (SUTURE) ×6 IMPLANT
SUT VICRYL 0 TIES 12 18 (SUTURE) ×3 IMPLANT
SYR 10ML ECCENTRIC (SYRINGE) IMPLANT
SYR 20ML LL LF (SYRINGE) ×3 IMPLANT
TOWEL OR 17X26 10 PK STRL BLUE (TOWEL DISPOSABLE) ×3 IMPLANT
TRAY FOLEY MTR SLVR 16FR STAT (SET/KITS/TRAYS/PACK) IMPLANT
TROCAR ADV FIXATION 5X100MM (TROCAR) IMPLANT
TROCAR BLADELESS OPT 5 100 (ENDOMECHANICALS) ×3 IMPLANT
TUBE CALIBRATION LAPBAND (TUBING) IMPLANT
TUBING INSUFFLATION 10FT LAP (TUBING) ×3 IMPLANT

## 2021-10-16 NOTE — Op Note (Signed)
   Surgeon: Wenda Low, MD, FACS  Asst:  Ivar Drape, MD, FACS 16 October 2021 Anes:  General endotracheal  Procedure: Robotic sleeve gastrectomy and upper endoscopy  Diagnosis: Morbid obesity  Complications: None noted  EBL:   minimal cc  Description of Procedure:  The patient was take to OR 2 and given general anesthesia.  The abdomen was prepped with Chloroprep and draped sterilely.  A timeout was performed.  Access to the abdomen was achieved with a 5 mm Optiview through the left upper quadrant.  Following insufflation, the state of the abdomen was found to be free of adhesions.  The ViSiGi 36Fr tube was inserted to deflate the stomach and was pulled back into the esophagus.  Four trocars were placed including a 12 mm for the robotic stapler.  The robot was docked from the right.  A 5 mm assistants port was placed on the right by Dr. Dossie Der.  The pylorus was identified and we measured 6 cm back and marked the antrum.  At that point we began dissection to take down the greater curvature of the stomach using the vessel sealer.  This dissection was taken all the way up to the left crus.  Posterior attachments of the stomach were also taken down.    The ViSiGi tube was then passed into the antrum and suction applied so that it was snug along the lessor curvature.  The "crow's foot" or incisura was identified.  The sleeve gastrectomy was begun using the Sureform platform stapler beginning with a two blue loads and then white loads.  When the sleeve was complete the tube was taken off suction and insufflated briefly.  The tube was withdrawn.  Upper endoscopy was then performed by Dr. Daphine Deutscher and the sleeve looked good and no bleeding or leaks were detected.     The specimen was extracted through the 12 mm trocar site. PMI was used to close the 12.   Local block was provided by infiltrating abdomen as a TAP block (at the beginning of the case) and then closed 4-0 Monocryl and  Dermabond.    Matt B. Daphine Deutscher, MD, [facility], [place] [phone]

## 2021-10-16 NOTE — Progress Notes (Addendum)
PHARMACY CONSULT FOR:  Risk Assessment for Post-Discharge VTE Following Bariatric Surgery  Post-Discharge VTE Risk Assessment: This patient's probability of 30-day post-discharge VTE is increased due to the factors marked:  X Male    Age >/=60 years    BMI >/=50 kg/m2    CHF    Dyspnea at Rest    Paraplegia   X Non-gastric-band surgery    Operation Time >/=3 hr    Return to OR     Length of Stay >/= 3 d   Hx of VTE   Hypercoagulable condition   Significant venous stasis   Predicted risk of 30-day post-discharge VTE: 0.31% (mild)  Other patient-specific factors to consider:   Recommendation for Discharge: Update: since patient only has 1 risk factor for portomesenteric vein thrombosis, new recommendation is no discharge VTE prophylaxis indicated  Daniel Gregory is a 19 y.o. male who underwent  robotic sleeve gastrectomy and upper endoscopy on 10/16/21   Case start: 1257 Case end: 1426   No Known Allergies  Patient Measurements: Weight: 135.8 kg (299 lb 6.4 oz) Body mass index is 41.76 kg/m.  No results for input(s): WBC, HGB, HCT, PLT, APTT, CREATININE, LABCREA, CREATININE, CREAT24HRUR, MG, PHOS, ALBUMIN, PROT, ALBUMIN, AST, ALT, ALKPHOS, BILITOT, BILIDIR, IBILI in the last 72 hours. Estimated Creatinine Clearance: 193.5 mL/min (by C-G formula based on SCr of 0.85 mg/dL).    Past Medical History:  Diagnosis Date   History of kidney stones      Medications Prior to Admission  Medication Sig Dispense Refill Last Dose   acetaminophen (TYLENOL) 325 MG tablet Take 650 mg by mouth every 6 (six) hours as needed for moderate pain or headache.   Past Week   dicyclomine (BENTYL) 10 MG capsule Take 1 capsule (10 mg total) by mouth 4 (four) times daily -  before meals and at bedtime. (Patient not taking: Reported on 10/02/2021) 30 capsule 0 Not Taking   HYDROcodone-acetaminophen (NORCO) 5-325 MG tablet Take 1 tablet by mouth every 4 (four) hours as needed. (Patient not taking:  Reported on 10/02/2021) 10 tablet 0 Not Taking   lactobacillus acidophilus & bulgar (LACTINEX) chewable tablet Chew 1 tablet by mouth 3 (three) times daily with meals. (Patient not taking: Reported on 10/02/2021) 15 tablet 1 Not Taking   omeprazole (PRILOSEC) 20 MG capsule Take 1 capsule (20 mg total) by mouth daily. (Patient not taking: Reported on 10/02/2021) 30 capsule 0 Not Taking   ondansetron (ZOFRAN) 4 MG tablet Take 1 tablet (4 mg total) by mouth every 6 (six) hours as needed for nausea or vomiting. (Patient not taking: Reported on 10/02/2021) 10 tablet 0 Not Taking    Rexford Maus, PharmD 10/16/2021 2:56 PM

## 2021-10-16 NOTE — Transfer of Care (Signed)
Immediate Anesthesia Transfer of Care Note  Patient: Daniel Gregory  Procedure(s) Performed: XI ROBOTIC GASTRIC SLEEVE RESECTION (Abdomen) UPPER GI ENDOSCOPY (Esophagus)  Patient Location: PACU  Anesthesia Type:General  Level of Consciousness: awake, alert  and oriented  Airway & Oxygen Therapy: Patient Spontanous Breathing and Patient connected to face mask oxygen  Post-op Assessment: Report given to RN, Post -op Vital signs reviewed and stable and Patient moving all extremities X 4  Post vital signs: Reviewed and stable  Last Vitals:  Vitals Value Taken Time  BP 159/89   Temp    Pulse 101 10/16/21 1442  Resp 27 10/16/21 1442  SpO2 100 % 10/16/21 1442  Vitals shown include unvalidated device data.  Last Pain:  Vitals:   10/16/21 1120  TempSrc: Oral         Complications: No notable events documented.

## 2021-10-16 NOTE — Anesthesia Postprocedure Evaluation (Signed)
Anesthesia Post Note  Patient: Daniel Gregory  Procedure(s) Performed: XI ROBOTIC GASTRIC SLEEVE RESECTION (Abdomen) UPPER GI ENDOSCOPY (Esophagus)     Patient location during evaluation: PACU Anesthesia Type: General Level of consciousness: awake Pain management: pain level controlled Vital Signs Assessment: post-procedure vital signs reviewed and stable Respiratory status: spontaneous breathing Cardiovascular status: stable Postop Assessment: no apparent nausea or vomiting Anesthetic complications: no   No notable events documented.  Last Vitals:  Vitals:   10/16/21 1445 10/16/21 1500  BP: (!) 168/95 (!) 166/90  Pulse: 98 99  Resp: (!) 27 13  Temp:    SpO2: 100% 100%    Last Pain:  Vitals:   10/16/21 1442  TempSrc:   PainSc: Asleep                 Josie Burleigh

## 2021-10-16 NOTE — Progress Notes (Signed)
MD Magnus Ivan was paged because patient was complaining of gas pain. Verbal order for PO Simethicone was given.

## 2021-10-16 NOTE — Anesthesia Preprocedure Evaluation (Addendum)
Anesthesia Evaluation  Patient identified by MRN, date of birth, ID band Patient awake    Reviewed: Allergy & Precautions, NPO status , Patient's Chart, lab work & pertinent test results  Airway Mallampati: II  TM Distance: >3 FB     Dental   Pulmonary neg pulmonary ROS,    breath sounds clear to auscultation       Cardiovascular negative cardio ROS   Rhythm:Regular Rate:Normal     Neuro/Psych negative neurological ROS     GI/Hepatic Neg liver ROS, History noted Dr. Chilton Si   Endo/Other  negative endocrine ROS  Renal/GU negative Renal ROS     Musculoskeletal   Abdominal   Peds  Hematology   Anesthesia Other Findings   Reproductive/Obstetrics                             Anesthesia Physical Anesthesia Plan  ASA: 2  Anesthesia Plan: General   Post-op Pain Management:    Induction:   PONV Risk Score and Plan: 3 and Ondansetron, Dexamethasone and Midazolam  Airway Management Planned: Oral ETT  Additional Equipment:   Intra-op Plan:   Post-operative Plan: Possible Post-op intubation/ventilation  Informed Consent: I have reviewed the patients History and Physical, chart, labs and discussed the procedure including the risks, benefits and alternatives for the proposed anesthesia with the patient or authorized representative who has indicated his/her understanding and acceptance.     Dental advisory given  Plan Discussed with: CRNA and Anesthesiologist  Anesthesia Plan Comments:         Anesthesia Quick Evaluation

## 2021-10-16 NOTE — Anesthesia Procedure Notes (Signed)
Procedure Name: Intubation Date/Time: 10/16/2021 12:32 PM Performed by: Niel Hummer, CRNA Pre-anesthesia Checklist: Patient identified, Suction available, Emergency Drugs available and Patient being monitored Patient Re-evaluated:Patient Re-evaluated prior to induction Oxygen Delivery Method: Circle system utilized Preoxygenation: Pre-oxygenation with 100% oxygen Induction Type: IV induction and Rapid sequence Laryngoscope Size: Mac and 4 Grade View: Grade I Tube type: Oral Tube size: 7.5 mm Number of attempts: 1 Airway Equipment and Method: Stylet Placement Confirmation: ETT inserted through vocal cords under direct vision, positive ETCO2 and breath sounds checked- equal and bilateral Secured at: 24 cm Tube secured with: Tape Dental Injury: Teeth and Oropharynx as per pre-operative assessment

## 2021-10-16 NOTE — Interval H&P Note (Signed)
History and Physical Interval Note:  10/16/2021 11:13 AM  Daniel Gregory  has presented today for surgery, with the diagnosis of MORBID OBESITY.  The various methods of treatment have been discussed with the patient and family. After consideration of risks, benefits and other options for treatment, the patient has consented to  Procedure(s): XI ROBOTIC GASTRIC SLEEVE RESECTION (N/A) UPPER GI ENDOSCOPY (N/A) as a surgical intervention.  The patient's history has been reviewed, patient examined, no change in status, stable for surgery.  I have reviewed the patient's chart and labs.  Questions were answered to the patient's satisfaction.     Valarie Merino

## 2021-10-16 NOTE — Progress Notes (Signed)

## 2021-10-17 ENCOUNTER — Encounter (HOSPITAL_COMMUNITY): Payer: Self-pay | Admitting: Surgery

## 2021-10-17 ENCOUNTER — Other Ambulatory Visit (HOSPITAL_COMMUNITY): Payer: Self-pay

## 2021-10-17 LAB — CBC WITH DIFFERENTIAL/PLATELET
Abs Immature Granulocytes: 0.08 10*3/uL — ABNORMAL HIGH (ref 0.00–0.07)
Basophils Absolute: 0 10*3/uL (ref 0.0–0.1)
Basophils Relative: 0 %
Eosinophils Absolute: 0 10*3/uL (ref 0.0–0.5)
Eosinophils Relative: 0 %
HCT: 42.9 % (ref 39.0–52.0)
Hemoglobin: 14.4 g/dL (ref 13.0–17.0)
Immature Granulocytes: 1 %
Lymphocytes Relative: 11 %
Lymphs Abs: 1.3 10*3/uL (ref 0.7–4.0)
MCH: 29.4 pg (ref 26.0–34.0)
MCHC: 33.6 g/dL (ref 30.0–36.0)
MCV: 87.6 fL (ref 80.0–100.0)
Monocytes Absolute: 1.1 10*3/uL — ABNORMAL HIGH (ref 0.1–1.0)
Monocytes Relative: 9 %
Neutro Abs: 9.2 10*3/uL — ABNORMAL HIGH (ref 1.7–7.7)
Neutrophils Relative %: 79 %
Platelets: 271 10*3/uL (ref 150–400)
RBC: 4.9 MIL/uL (ref 4.22–5.81)
RDW: 12.6 % (ref 11.5–15.5)
WBC: 11.7 10*3/uL — ABNORMAL HIGH (ref 4.0–10.5)
nRBC: 0 % (ref 0.0–0.2)

## 2021-10-17 MED ORDER — PANTOPRAZOLE SODIUM 40 MG PO TBEC
40.0000 mg | DELAYED_RELEASE_TABLET | Freq: Every day | ORAL | 0 refills | Status: DC
  Filled 2021-10-17: qty 90, 90d supply, fill #0

## 2021-10-17 MED ORDER — ONDANSETRON 4 MG PO TBDP
4.0000 mg | ORAL_TABLET | Freq: Four times a day (QID) | ORAL | 0 refills | Status: DC | PRN
  Filled 2021-10-17: qty 20, 5d supply, fill #0

## 2021-10-17 MED ORDER — OXYCODONE HCL 5 MG PO TABS
5.0000 mg | ORAL_TABLET | Freq: Four times a day (QID) | ORAL | 0 refills | Status: DC | PRN
  Filled 2021-10-17: qty 10, 3d supply, fill #0

## 2021-10-17 NOTE — Progress Notes (Signed)
Patient alert and oriented, pain is controlled. Patient is tolerating fluids, advanced to protein shake today, patient is tolerating well.  Reviewed Gastric sleeve discharge instructions with patient and patient is able to articulate understanding.  Provided information on BELT program, Support Group and [facility] outpatient pharmacy. All questions answered, will continue to monitor.  

## 2021-10-17 NOTE — Progress Notes (Signed)
Patient alert and oriented, Post op day 1.  Provided support and encouragement.  Encouraged pulmonary toilet, ambulation and small sips of liquids.  All questions answered.  Will continue to monitor. 

## 2021-10-17 NOTE — Discharge Instructions (Signed)

## 2021-10-17 NOTE — Discharge Summary (Signed)
Physician Discharge Summary  Patient ID: Daniel Gregory MRN: [id number] DOB/AGE: [birth year] 21 y.o.  PCP: Gordan Payment., MD  Admit date: 10/16/2021 Discharge date: 10/17/2021  Admission Diagnoses:  morbid obesity  Discharge Diagnoses:  same  Principal Problem:   S/P laparoscopic sleeve gastrectomy   Surgery:  Xi robotic sleeve gastrectomy  Discharged Condition: improved  Hospital Course:   had surgery on Tuesday.  Was begun on clear liquids and advanced.  Ready for discharge on Wednesday.    Consults: none  Significant Diagnostic Studies: none    Discharge Exam: Blood pressure (!) 163/88, pulse 62, temperature 97.9 F (36.6 C), temperature source Oral, resp. rate 16, weight 135.8 kg, SpO2 98 %. Incisions bland  Disposition: Discharge disposition: 01-Home or Self Care       Discharge Instructions     Ambulate hourly while awake   Complete by: As directed    Call MD for:  difficulty breathing, headache or visual disturbances   Complete by: As directed    Call MD for:  persistant dizziness or light-headedness   Complete by: As directed    Call MD for:  persistant nausea and vomiting   Complete by: As directed    Call MD for:  redness, tenderness, or signs of infection (pain, swelling, redness, odor or green/yellow discharge around incision site)   Complete by: As directed    Call MD for:  severe uncontrolled pain   Complete by: As directed    Call MD for:  temperature >101 F   Complete by: As directed    Diet bariatric full liquid   Complete by: As directed    Discharge instructions   Complete by: As directed    Take colace as stool softener;  you may use Miralax daily if needed and if that is not working would use a Dulcolax suppository (over the counter)   Discharge wound care:   Complete by: As directed    You may shower tonight   Incentive spirometry   Complete by: As directed    Perform hourly while awake      Allergies as of 10/17/2021   No  Known Allergies      Medication List     STOP taking these medications    acetaminophen 325 MG tablet Commonly known as: TYLENOL   dicyclomine 10 MG capsule Commonly known as: Bentyl   HYDROcodone-acetaminophen 5-325 MG tablet Commonly known as: Norco   lactobacillus acidophilus & bulgar chewable tablet   omeprazole 20 MG capsule Commonly known as: PRILOSEC   ondansetron 4 MG tablet Commonly known as: ZOFRAN       TAKE these medications    ondansetron 4 MG disintegrating tablet Commonly known as: ZOFRAN-ODT Take 1 tablet (4 mg total) by mouth every 6 (six) hours as needed for nausea or vomiting.   oxyCODONE 5 MG immediate release tablet Commonly known as: Oxy IR/ROXICODONE Take 1 tablet (5 mg total) by mouth every 6 (six) hours as needed for severe pain.   pantoprazole 40 MG tablet Commonly known as: PROTONIX Take 1 tablet (40 mg total) by mouth daily.               Discharge Care Instructions  (From admission, onward)           Start     Ordered   10/17/21 0000  Discharge wound care:       Comments: You may shower tonight   10/17/21 1214  Follow-up Information     Luretha Murphy, MD. Go on 11/07/2021.   Specialty: General Surgery Why: at 10:45am at the [facility] OFFICE.  Please arrive 15 minutes prior to your appointment time.  Thank you. Contact information: [address] [place] [address] [phone]         Surgery, [place]. Go on 12/12/2021.   Specialty: General Surgery Why: at 9:15am with Dr. Daphine Deutscher.  Please arrive 15 minutes prior to the appointment time.  Thank you. Contact information: [address] [place] [address] [phone]                 Signed: Valarie Merino 10/17/2021, 12:15 PM

## 2021-10-17 NOTE — Progress Notes (Signed)
Nurse reviewed discharge instructions with pt.  Pt verbalized understanding of discharge instructions, follow up appointments and new medications.  Medications were delivered to pt prior to discharge.  Pt ambulated off unit with nurse tech at discharge.

## 2021-10-18 LAB — SURGICAL PATHOLOGY

## 2021-10-19 ENCOUNTER — Telehealth (HOSPITAL_COMMUNITY): Payer: Self-pay | Admitting: *Deleted

## 2021-10-19 NOTE — Telephone Encounter (Signed)
1.  Tell me about your pain and pain management? Pt states that pain is getting better.  States that he has only had to take two pain pills since being discharged home.    2.  Let's talk about fluid intake.  How much total fluid are you taking in? Pt states that he is working to meet goal of 64 oz of fluid today.  Pt has consumed two protein shakes and a bottle of water each day.  Pt states that its getting better and is encouraged to continue to work towards meeting goal.  Pt instructed to assess status and suggestions daily utilizing Hydration Action Plan on discharge folder and to call [facility] if in the "red zone".   3.  How much protein have you taken in the last 2 days? Pt states that he is working to meet goal of goal of 80g of protein today.  Pt has consumed  two protein shakes each day since being home.  Discussed with pt that goal of 80g would require at least three shakes per day.  Pt plans to drink remainder of protein throughout the rest of the day to meet goal.  4.  Have you had nausea?  Tell me about when have experienced nausea and what you did to help? Pt denies current nausea.   5.  Has the frequency or color changed with your urine? Pt states that he is urinating "fine" with no changes in frequency or urgency.     6.  Tell me what your incisions look like? "Incisions look fine". Pt denies a fever, chills.  Pt states incisions are not swollen, open, or draining.  Pt encouraged to call [facility] if incisions change.   7.  Have you been passing gas? BM? Pt states that he is having BMs. Last BM 10/19/21.     8.  If a problem or question were to arise who would you call?  Do you know contact numbers for [facility], [facility], and [facility]? Pt denies dehydration symptoms.  Pt can describe s/sx of dehydration.  Pt knows to call [facility] for surgical, [facility] for nutrition, and [facility] for non-urgent questions or concerns.   9.  How has the walking going? Pt states he is walking around and able to be active without  difficulty. Pt states that he is "out and about driving" per instructions of Dr. Daphine Deutscher.  Discussed with pt general rule to wait at least one week and to ensure that he is not taking any medication that would make him drowsy.   10. Are you still using your incentive spirometer?  If so, how often? Pt states that is doing the I.S. some because it is helping him to "get up the mucous from his lungs".  He feels like he's gotten most of it out. Pt encouraged to use incentive spirometer, at least 10x every hour while awake until he sees the surgeon.  11.  How are your vitamins and calcium going?  How are you taking them? Pt states that he is taking his supplements and vitamins without difficulty.  Reminded patient that the first 30 days post-operatively are important for successful recovery.  Practice good hand hygiene, wearing a mask when appropriate (since optional in most places), and minimizing exposure to people who live outside of the home, especially if they are exhibiting any respiratory, GI, or illness-like symptoms.

## 2021-10-30 ENCOUNTER — Encounter: Payer: BC Managed Care – PPO | Attending: Surgery | Admitting: Skilled Nursing Facility1

## 2021-10-30 ENCOUNTER — Other Ambulatory Visit: Payer: Self-pay

## 2021-10-30 DIAGNOSIS — E669 Obesity, unspecified: Secondary | ICD-10-CM | POA: Diagnosis not present

## 2021-10-31 NOTE — Progress Notes (Addendum)
2 Week Post-Operative Nutrition Class   Patient was seen on 10/30/2021 for Post-Operative Nutrition education at the [facility].    Surgery date: 10/16/2021 Surgery type: sleeve Start weight at [facility]: 311 Weight today: pt declined Bowel Habits: Every day to every other day no complaints     The following the learning objectives were met by the patient during this course: Identifies Phase 3 (Soft, High Proteins) Dietary Goals and will begin from 2 weeks post-operatively to 2 months post-operatively Identifies appropriate sources of fluids and proteins  Identifies appropriate fat sources and healthy verses unhealthy fat types   States protein recommendations and appropriate sources post-operatively Identifies the need for appropriate texture modifications, mastication, and bite sizes when consuming solids Identifies appropriate fat consumption and sources Identifies appropriate multivitamin and calcium sources post-operatively Describes the need for physical activity post-operatively and will follow MD recommendations States when to call healthcare provider regarding medication questions or post-operative complications   Handouts given during class include: Phase 3A: Soft, High Protein Diet Handout Phase 3 High Protein Meals Healthy Fats   Follow-Up Plan: Patient will follow-up at [facility] in 6 weeks for 2 month post-op nutrition visit for diet advancement per MD.

## 2021-11-05 ENCOUNTER — Telehealth: Payer: Self-pay | Admitting: Skilled Nursing Facility1

## 2021-11-05 NOTE — Telephone Encounter (Signed)
RD called pt to verify fluid intake once starting soft, solid proteins 2 week post-bariatric surgery.   Daily Fluid intake: 64 oz Daily Protein intake: 80 g Bowel Habits: every other day to every day  Concerns/issues:    None stated

## 2021-12-11 ENCOUNTER — Encounter: Payer: BC Managed Care – PPO | Attending: Surgery | Admitting: Skilled Nursing Facility1

## 2021-12-11 ENCOUNTER — Other Ambulatory Visit: Payer: Self-pay

## 2021-12-11 DIAGNOSIS — E669 Obesity, unspecified: Secondary | ICD-10-CM | POA: Diagnosis present

## 2021-12-11 NOTE — Progress Notes (Signed)
Bariatric Nutrition Follow-Up Visit Medical Nutrition Therapy   NUTRITION ASSESSMENT    Surgery date: 10/16/2021 Surgery type: sleeve Start weight at [facility]: 311 Weight today: 263.2 pounds  Body Composition Scale 12/11/2021  Weight  lbs 263.2  Total Body Fat  % 30.5     Visceral Fat 17  Fat-Free Mass  % 69.4     Total Body Water  % 50.4     Muscle-Mass  lbs 49.6  BMI 36.5  Body Fat Displacement ---        Torso  lbs 49.8        Left Leg  lbs 9.9        Right Leg  lbs 9.9        Left Arm  lbs 4.9        Right Arm  lbs 4.9   Clinical  Medical hx: Kidney stones Medications: N/A  Labs:  Notable signs/symptoms: N/A Any previous deficiencies? No   Lifestyle & Dietary Hx  Pt states from time to time he gets a little tired.  Pt states he plans on going to the gym for 3-4 days a week  Pt states he wonders if 50 pounds is too much too fast to lose and does not wish to lose anymore muscle mass as he saw what happened to his parents.   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    24-Hr Dietary Recall First Meal: eggs + bacon or sausage  Snack:  cheese Second Meal: chicken or hamburger meat Snack:  pepperoni or salami or ham Third Meal: chicken or hamburger meat or BBQ Snack:  Beverages: unsweet tea, water, properl  Post-Op Goals/ Signs/ Symptoms Using straws: no Drinking while eating: no Chewing/swallowing difficulties: no Changes in vision: no Changes to mood/headaches: no Hair loss/changes to skin/nails: no Difficulty focusing/concentrating: no Sweating: no Limb weakness: no Dizziness/lightheadedness: no Palpitations: no  Carbonated/caffeinated beverages: no N/V/D/C/Gas: no Abdominal pain: no Dumping syndrome: no    NUTRITION DIAGNOSIS  Overweight/obesity ([place]-3.3) related to past poor dietary habits and physical inactivity  as evidenced by completed bariatric surgery and following dietary guidelines for continued weight loss and healthy nutrition status.     NUTRITION INTERVENTION Nutrition counseling (C-1) and education (E-2) to facilitate bariatric surgery goals, including: Diet advancement to the next phase (phase 4) now including non starchy vegetables  The importance of consuming adequate calories as well as certain nutrients daily due to the body's need for essential vitamins, minerals, and fats The importance of daily physical activity and to reach a goal of at least 150 minutes of moderate to vigorous physical activity weekly (or as directed by their physician) due to benefits such as increased musculature and improved lab values The importance of intuitive eating specifically learning hunger-satiety cues and understanding the importance of learning a new body: The importance of mindful eating to avoid grazing behaviors  Goals: -add in resistance  -Continue to aim for a minimum of 64 fluid ounces 7 days a week with at least 30 ounces being plain water -Eat non-starchy vegetables 2 times a day 7 days a week -Start out with soft cooked vegetables today and tomorrow; if tolerated begin to eat raw vegetables or cooked including salads -Eat your 3 ounces of protein first then start in on your non-starchy vegetables; once you understand how much of your meal leads to satisfaction and not full while  still eating 3 ounces of protein and non-starchy vegetables you can eat them in any order  -Continue to aim for 30 minutes of activity at least 5 times a week -Do NOT cook with/add to your food: alfredo sauce, cheese sauce, barbeque sauce, ketchup, fat back, butter, bacon grease, grease, Crisco, OR SUGAR -2 non starchy servings per day 7 days a week -2 fruit servings per day  Handouts Provided Include  Phase 4  Learning Style & Readiness for Change Teaching method utilized: Visual & Auditory  Demonstrated degree of  understanding via: Teach Back  Readiness Level: action Barriers to learning/adherence to lifestyle change: none identified  RD's Notes for Next Visit Assess adherence to pt chosen goals     MONITORING & EVALUATION Dietary intake, weekly physical activity, body weight  Next Steps Patient is to follow-up in 2 months

## 2022-01-29 ENCOUNTER — Other Ambulatory Visit: Payer: Self-pay

## 2022-01-29 ENCOUNTER — Encounter: Payer: BC Managed Care – PPO | Attending: Surgery | Admitting: Skilled Nursing Facility1

## 2022-01-29 ENCOUNTER — Other Ambulatory Visit (HOSPITAL_COMMUNITY): Payer: Self-pay

## 2022-01-29 DIAGNOSIS — E669 Obesity, unspecified: Secondary | ICD-10-CM | POA: Diagnosis not present

## 2022-01-29 NOTE — Progress Notes (Signed)
Bariatric Nutrition Follow-Up Visit ?Medical Nutrition Therapy  ? ?NUTRITION ASSESSMENT ?  ? ?Surgery date: 10/16/2021 ?Surgery type: sleeve ?Start weight at [facility]: 311 ?Weight today: 244.6 pounds ? ?Body Composition Scale 12/11/2021 01/29/2022  ?Weight  lbs 263.2 244.6  ?Total Body Fat  % 30.5 27.8  ?   Visceral Fat 17 15  ?Fat-Free Mass  % 69.4 72.1  ?   Total Body Water  % 50.4 53.1  ?   Muscle-Mass  lbs 49.6 46.1  ?BMI 36.5 33.9  ?Body Fat Displacement ---   ?      Torso  lbs 49.8 42.3  ?      Left Leg  lbs 9.9 8.4  ?      Right Leg  lbs 9.9 8.4  ?      Left Arm  lbs 4.9 4.2  ?      Right Arm  lbs 4.9 4.2  ? ?Clinical  ?Medical hx: Kidney stones ?Medications: N/A  ?Labs:  ?Notable signs/symptoms: N/A ?Any previous deficiencies? No ?  ?Lifestyle & Dietary Hx ? ?Pt states his energy is much better than last time since adding in complex carbohydrates.  ?Pt states he is struggling with syncope upon standing.  ? 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 ?24-Hr Dietary Recall ?First Meal: eggs + bacon or sausage  ?Snack:  cup of fruit (2-3 days a week) ?Second Meal: chicken or hamburger meat ?Snack: ?Third Meal: chicken or hamburger meat or BBQ + green beans or broccoli ?Snack:  ?Beverages: unsweet tea, water, propel ? ?Post-Op Goals/ Signs/ Symptoms ?Using straws: no ?Drinking while eating: no ?Chewing/swallowing difficulties: no ?Changes in vision: no ?Changes to mood/headaches: no ?Hair loss/changes to skin/nails: no ?Difficulty focusing/concentrating: no ?Sweating: no ?Limb weakness: no ?Dizziness/lightheadedness: no ?Palpitations: no  ?Carbonated/caffeinated beverages: no ?N/V/D/C/Gas: no ?Abdominal pain: no ?Dumping syndrome: no ? ?  ?NUTRITION DIAGNOSIS  ?Overweight/obesity ([place]-3.3) related to past poor dietary habits and physical inactivity as  evidenced by completed bariatric surgery and following dietary guidelines for continued weight loss and healthy nutrition status. ?  ?  ?NUTRITION INTERVENTION ?Nutrition counseling (C-1) and education (E-2) to facilitate bariatric surgery goals, including: ?The importance of consuming adequate calories as well as certain nutrients daily due to the body's need for essential vitamins, minerals, and fats ?The importance of daily physical activity and to reach a goal of at least 150 minutes of moderate to vigorous physical activity weekly (or as directed by their physician) due to benefits such as increased musculature and improved lab values ?The importance of intuitive eating specifically learning hunger-satiety cues and understanding the importance of learning a new body: The importance of mindful eating to avoid grazing behaviors  ?Encouraged patient to honor their body's internal hunger and fullness cues.  Throughout the day, check in mentally and rate hunger. Stop eating when satisfied not full regardless of how much food is left on the plate.  Get more if still hungry 20-30 minutes later.  The key is to honor satisfaction so throughout the meal, rate fullness factor and stop when comfortably satisfied not physically full. The key is to honor hunger and fullness without any feelings of guilt or shame.  Pay attention to what the internal cues are, rather than any external factors. This will enhance the confidence you have in listening to your own body  and following those internal cues enabling you to increase how often you eat when you are hungry not out of appetite and stop when you are satisfied not full.  ?Encouraged pt to continue to eat balanced meals inclusive of non starchy vegetables 2 times a day 7 days a week ?Encouraged pt to choose lean protein sources: limiting beef, pork, sausage, hotdogs, and lunch meat ?Encourage pt to choose healthy fats such as plant based limiting animal fats ?Encouraged pt to  continue to drink a minium 64 fluid ounces with half being plain water to satisfy proper hydration  ? ?Goals: ?-add in 50 grams of complex carbohydrates per day  ? ?Handouts Provided Include  ?Phase 5 ? ?Learning Style & Readiness for Change ?Teaching method utilized: Visual & Auditory  ?Demonstrated degree of understanding via: Teach Back  ?Readiness Level: action ?Barriers to learning/adherence to lifestyle change: none identified ? ?RD's Notes for Next Visit ?Assess adherence to pt chosen goals ?  ? ?MONITORING & EVALUATION ?Dietary intake, weekly physical activity, body weight ? ?Next Steps ?Patient is to follow-up in 3 months ?

## 2022-02-05 ENCOUNTER — Other Ambulatory Visit (HOSPITAL_COMMUNITY): Payer: Self-pay

## 2022-02-12 IMAGING — RF DG UGI W SINGLE CM
10 of 12 series · 13 of 18 positions shown · non-contrast
Comparison: 01/31/2020 abdominopelvic CT from [REDACTED].
Most recent [HOSPITAL] CT 04/11/2017.

CLINICAL DATA: Preop for bariatric surgery. Morbid obesity. Patient
asymptomatic.

EXAM:
UPPER GI SERIES WITH KUB
TECHNIQUE: After obtaining a scout radiograph a routine upper GI series was
performed using thin barium
FLUOROSCOPY TIME:  Fluoroscopy Time:  3 minutes and 30 seconds
Radiation Exposure Index (if provided by the fluoroscopic device):
Not applicable.
Number of Acquired Spot Images: 0

[Series 1: t abdomen supine · 0.15mm/px · 1 of 1 slices shown]
[im 1/1]
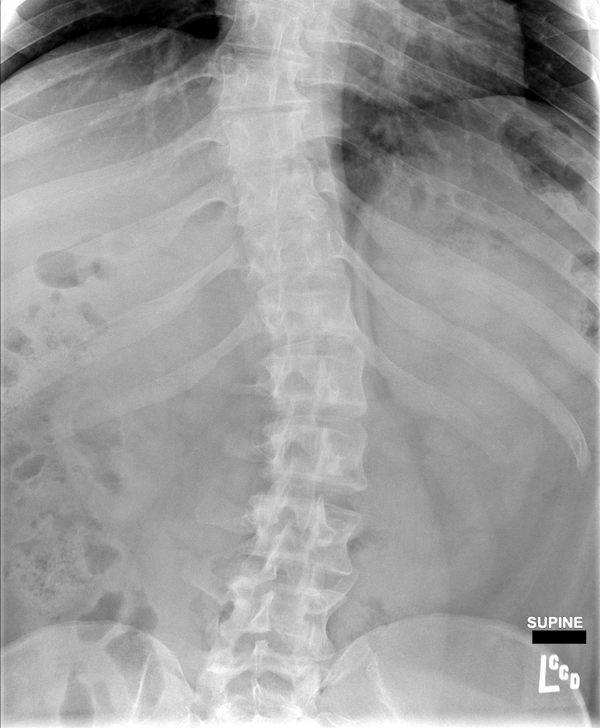

[Series 2: cp_standard · 0.52mm/px · 3 of 156 frames shown (1 of 9)]
[frame 72/156]
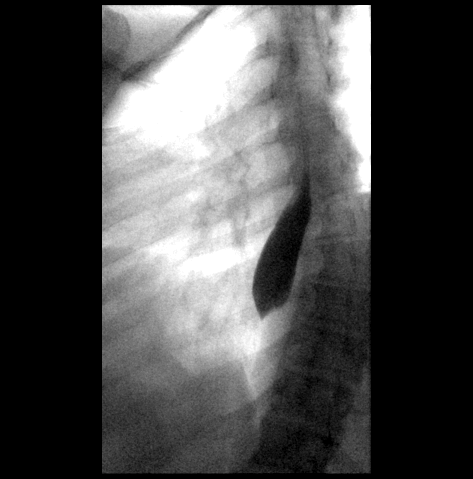
[frame 79/156]
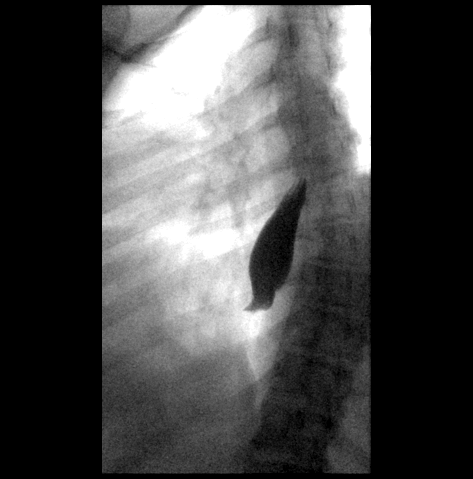
[frame 133/156]
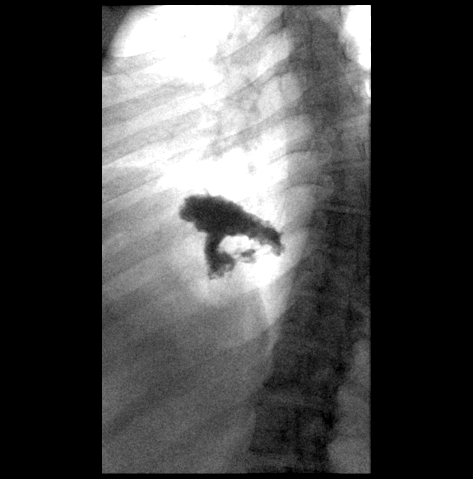

[Series 3: cp_standard · 0.52mm/px · 2 of 497 frames shown (2 of 9)]
[frame 249/497]
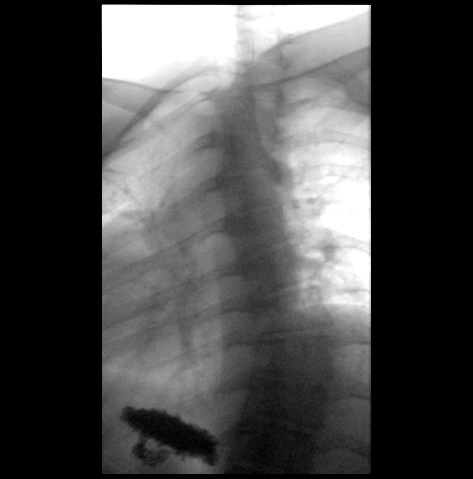
[frame 423/497]
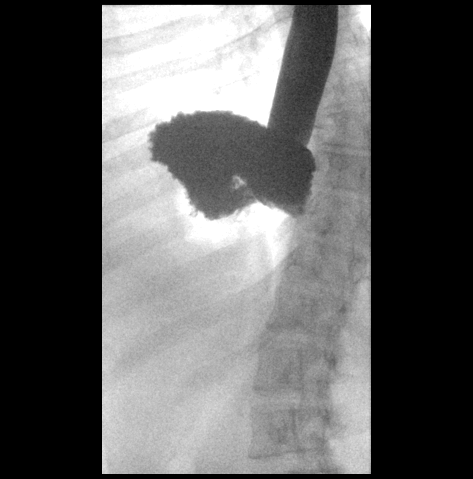

[Series 4: cp_standard · 0.18mm/px · 1 of 1 slices shown (3 of 9)]
[im 1/1]
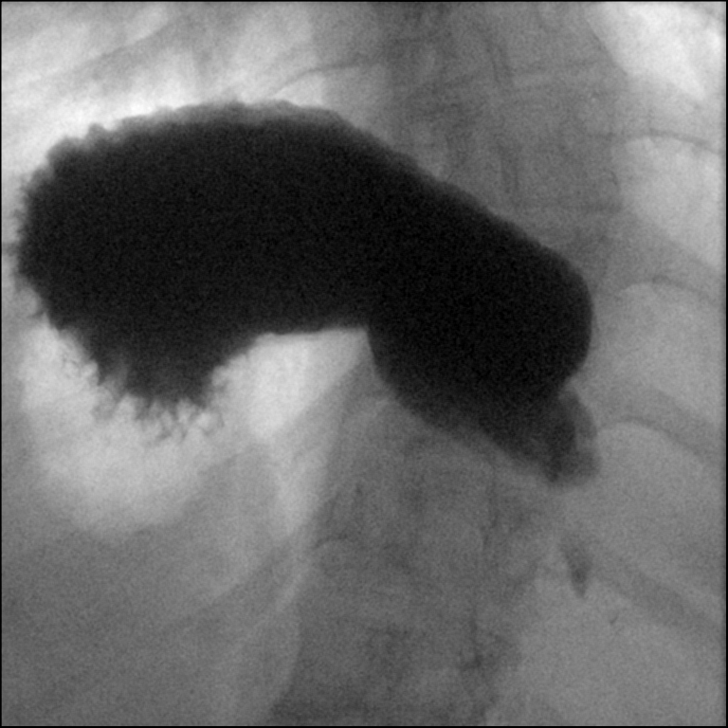

[Series 5: cp_standard · 0.26mm/px · 1 of 1 slices shown (4 of 9)]
[im 1/1]
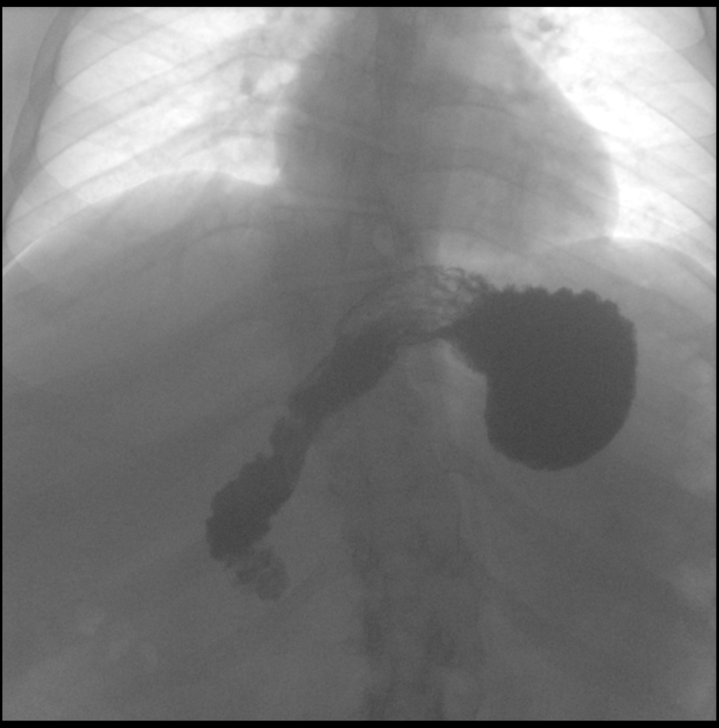

[Series 6: cp_standard · 0.26mm/px · 1 of 1 slices shown (5 of 9)]
[im 1/1]
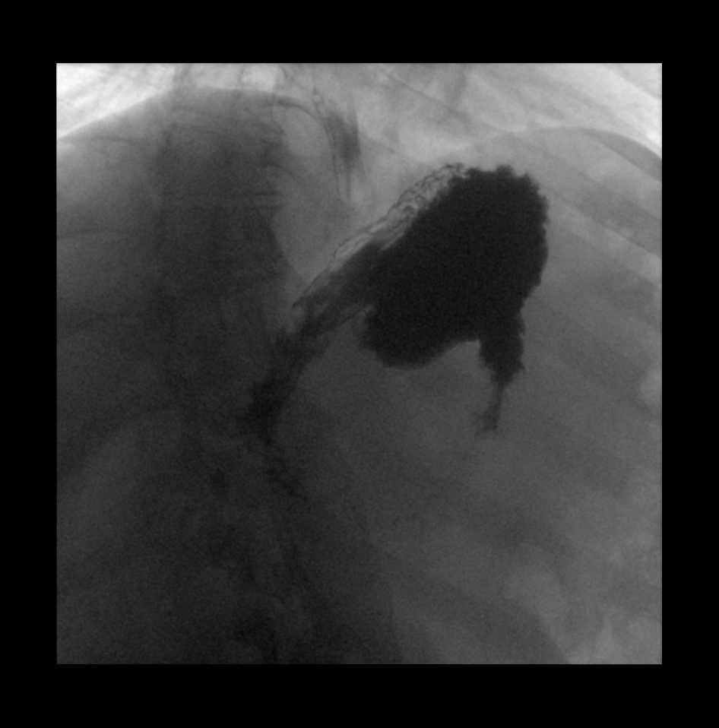

[Series 8: cp_standard · 0.26mm/px · 1 of 1 slices shown (6 of 9)]
[im 1/1]
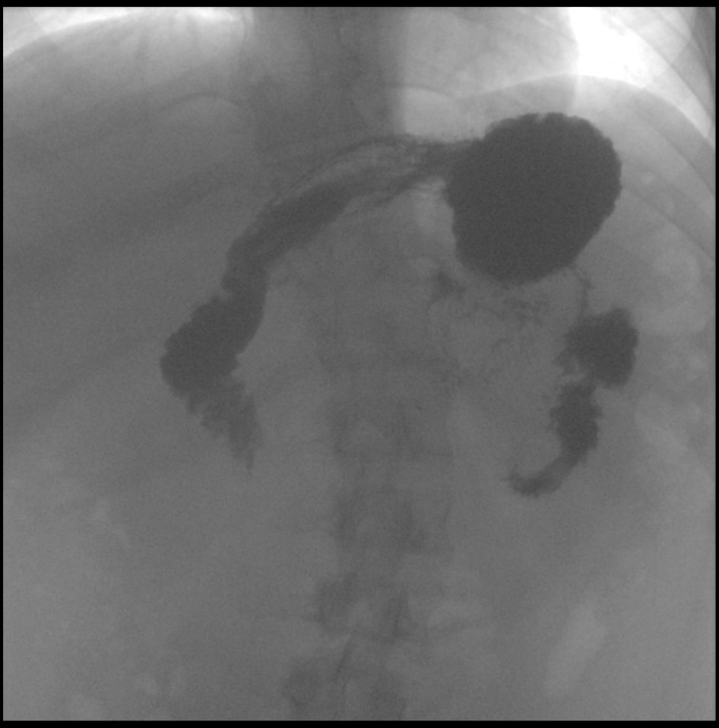

[Series 9: cp_standard · 0.26mm/px · 1 of 1 slices shown (7 of 9)]
[im 1/1]
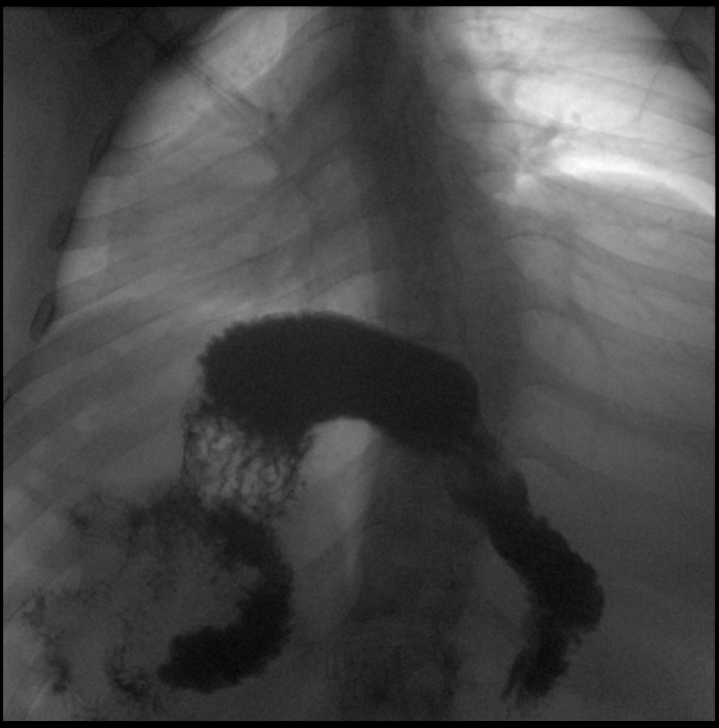

[Series 10: cp_standard · 0.26mm/px · 1 of 1 slices shown (8 of 9)]
[im 1/1]
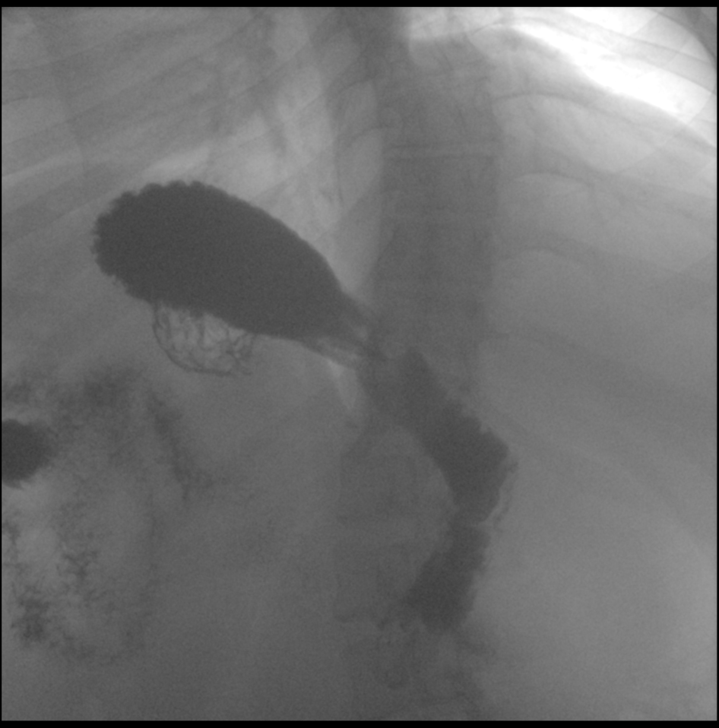

[Series 12: cp_standard · 0.27mm/px · 1 of 1 slices shown (9 of 9)]
[im 1/1]
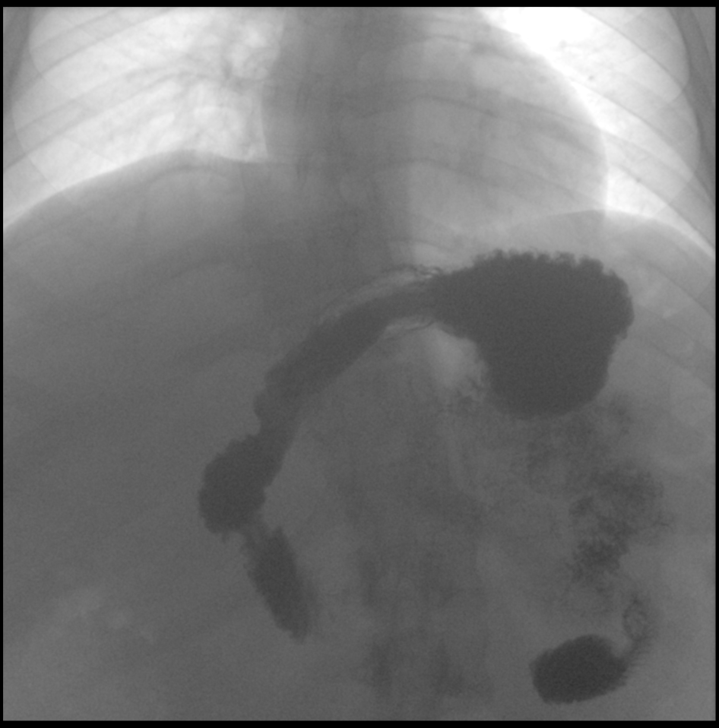

[13 of 18 positions shown; findings below may reference images not displayed]

FINDINGS: Preprocedure scout film demonstrates a nonobstructive bowel-gas
pattern. Moderate S shaped thoracolumbar spine curvature.

Normal esophageal primary peristaltic wave.

Full column evaluation of the esophagus demonstrates no persistent
narrowing or stricture. No hiatal hernia.

Single-contrast evaluation of the stomach demonstrates a somewhat
atypical appearance, with the gastric antrum and distal body being
positioned somewhat superior to the proximal body. This is favored
to be projectional/positional in the setting of spinal curvature. No
evidence of gastric abnormality on prior CTs.

Prompt filling of the duodenal bulb and C-loop, which are normal in
appearance. Normal proximal small bowel caliber.
IMPRESSION: Normal upper GI, as detailed above.

## 2022-04-22 ENCOUNTER — Ambulatory Visit: Payer: BC Managed Care – PPO | Admitting: Skilled Nursing Facility1

## 2023-06-03 ENCOUNTER — Encounter (HOSPITAL_COMMUNITY): Payer: Self-pay | Admitting: *Deleted

## 2023-12-22 ENCOUNTER — Encounter (HOSPITAL_BASED_OUTPATIENT_CLINIC_OR_DEPARTMENT_OTHER): Payer: Self-pay

## 2023-12-22 ENCOUNTER — Other Ambulatory Visit (HOSPITAL_BASED_OUTPATIENT_CLINIC_OR_DEPARTMENT_OTHER): Payer: Self-pay

## 2023-12-22 ENCOUNTER — Ambulatory Visit (HOSPITAL_BASED_OUTPATIENT_CLINIC_OR_DEPARTMENT_OTHER)
Admission: RE | Admit: 2023-12-22 | Discharge: 2023-12-22 | Disposition: A | Discharge status: Home / Self Care | Payer: Medicaid Other | Source: Ambulatory Visit | Attending: Family Medicine | Admitting: Family Medicine

## 2023-12-22 VITALS — BP 121/75 | HR 106 | Temp 100.1°F | Resp 20

## 2023-12-22 DIAGNOSIS — J101 Influenza due to other identified influenza virus with other respiratory manifestations: Secondary | ICD-10-CM

## 2023-12-22 DIAGNOSIS — R509 Fever, unspecified: Secondary | ICD-10-CM

## 2023-12-22 DIAGNOSIS — R051 Acute cough: Secondary | ICD-10-CM | POA: Diagnosis not present

## 2023-12-22 MED ORDER — PROMETHAZINE-DM 6.25-15 MG/5ML PO SYRP
5.0000 mL | ORAL_SOLUTION | Freq: Four times a day (QID) | ORAL | 0 refills | Status: DC | PRN
  Filled 2023-12-22: qty 118, 6d supply, fill #0

## 2023-12-22 MED ORDER — OSELTAMIVIR PHOSPHATE 75 MG PO CAPS
75.0000 mg | ORAL_CAPSULE | Freq: Two times a day (BID) | ORAL | 0 refills | Status: DC
  Filled 2023-12-22: qty 10, 5d supply, fill #0

## 2023-12-22 NOTE — ED Triage Notes (Signed)
Pt c/o yesterday evening chest congestion, started this morning with fever 101, coughing, headache, sore throat, chills.

## 2023-12-22 NOTE — ED Provider Notes (Signed)
[facility]    CSN: [id number] Arrival date & time: 12/22/23  1627      History   Chief Complaint Chief Complaint  Patient presents with   Cough   Fever    HPI Daniel Gregory is a 23 y.o. male.   Patient reports he had some chest congestion and cough last night.  This morning he woke up and felt like he had been run over by a train.  He reports fever, cough, body aches, chills, headaches, sore throat.  His girlfriend was diagnosed with influenza type a end of last week.   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 Authorizing Provider  oseltamivir (TAMIFLU) 75 MG capsule Take 1 capsule (75 mg total) by mouth every 12 (twelve) hours. 12/22/23  Yes Prescilla Sours, FNP  promethazine-dextromethorphan (PROMETHAZINE-DM) 6.25-15 MG/5ML syrup Take 5 mLs by mouth 4 (four) times daily as needed for cough. Do not use and drive - May make drowsy. 12/22/23  Yes Prescilla Sours, FNP    Family History History reviewed. No pertinent family history.  Social History Social History   Tobacco Use   Smoking status: Never    Smokeless tobacco: Never  Vaping Use   Vaping status: Never Used  Substance Use Topics   Alcohol use: Yes    Comment: rare   Drug use: Never     Allergies   Patient has no known allergies.   Review of Systems Review of Systems  Constitutional:  Positive for chills and fever.  HENT:  Positive for congestion and sore throat. Negative for ear pain.   Eyes:  Negative for pain and visual disturbance.  Respiratory:  Positive for cough. Negative for shortness of breath.   Cardiovascular:  Negative for chest pain and palpitations.  Gastrointestinal:  Negative for abdominal pain, constipation, diarrhea, nausea and vomiting.  Genitourinary:  Negative for dysuria and hematuria.  Musculoskeletal:  Negative for arthralgias and back pain.  Skin:  Negative for color change and rash.  Neurological:  Positive for headaches. Negative for seizures and syncope.  All other systems reviewed and are negative.    Physical Exam Triage Vital Signs ED Triage Vitals  Encounter Vitals Group     BP 12/22/23 1717 121/75     Systolic BP Percentile --      Diastolic BP Percentile --      Pulse Rate 12/22/23 1717 (!) 106     Resp 12/22/23 1717 20     Temp 12/22/23 1717 100.1 F (37.8 C)     Temp  Source 12/22/23 1717 Oral     SpO2 12/22/23 1717 97 %     Weight --      Height --      Head Circumference --      Peak Flow --      Pain Score 12/22/23 1716 0     Pain Loc --      Pain Education --      Exclude from Growth Chart --    No data found.  Updated Vital Signs BP 121/75 (BP Location: Right Arm)   Pulse (!) 106   Temp 100.1 F (37.8 C) (Oral)   Resp 20   SpO2 97%   Visual Acuity Right Eye Distance:   Left Eye Distance:   Bilateral Distance:    Right Eye Near:   Left Eye Near:    Bilateral Near:     Physical Exam Vitals and nursing note reviewed.  Constitutional:      General: He is not in acute distress.    Appearance: He is well-developed. He is ill-appearing. He is not  toxic-appearing.  HENT:     Head: Normocephalic and atraumatic.     Right Ear: Hearing, tympanic membrane, ear canal and external ear normal.     Left Ear: Hearing, tympanic membrane, ear canal and external ear normal.     Nose: Congestion and rhinorrhea present. Rhinorrhea is clear.     Right Sinus: No maxillary sinus tenderness or frontal sinus tenderness.     Left Sinus: No maxillary sinus tenderness or frontal sinus tenderness.     Mouth/Throat:     Lips: Pink.     Mouth: Mucous membranes are moist.     Pharynx: Uvula midline. No oropharyngeal exudate or posterior oropharyngeal erythema.     Tonsils: No tonsillar exudate.  Eyes:     Conjunctiva/sclera: Conjunctivae normal.     Pupils: Pupils are equal, round, and reactive to light.  Cardiovascular:     Rate and Rhythm: Normal rate and regular rhythm.     Heart sounds: S1 normal and S2 normal. No murmur heard. Pulmonary:     Effort: Pulmonary effort is normal. No respiratory distress.     Breath sounds: Normal breath sounds. No decreased breath sounds, wheezing, rhonchi or rales.  Abdominal:     Palpations: Abdomen is soft.     Tenderness: There is no abdominal tenderness.  Musculoskeletal:        General: No swelling.     Cervical back: Neck supple.  Lymphadenopathy:     Head:     Right side of head: No submental, submandibular, tonsillar, preauricular or posterior auricular adenopathy.     Left side of head: No submental, submandibular, tonsillar, preauricular or posterior auricular adenopathy.     Cervical: No cervical adenopathy.     Right cervical: No superficial cervical adenopathy.    Left cervical: No superficial cervical adenopathy.  Skin:    General: Skin is warm and dry.     Capillary Refill: Capillary refill takes less than 2 seconds.     Findings: No rash.  Neurological:     Mental Status: He is alert and oriented to person, place, and time.  Psychiatric:        Mood and Affect: Mood normal.      UC  Treatments / Results  Labs (all labs ordered are listed, but only abnormal results are displayed) Labs Reviewed - No data to display  EKG   Radiology No results found.  Procedures Procedures (  including critical care time)  Medications Ordered in UC Medications - No data to display  Initial Impression / Assessment and Plan / UC Course  I have reviewed the triage vital signs and the nursing notes.  Pertinent labs & imaging results that were available during my care of the patient were reviewed by me and considered in my medical decision making (see chart for details).     Influenza testing not available today.  History and exam are most consistent with influenza type A.  He was also exposed to influenza type a over the weekend.  Will treat with Tamiflu, 75 mg, twice daily for 5 days.  Promethazine DM, 5 mL, every 6 hours as needed for cough.  Get plenty of fluids and rest.  Acetaminophen or ibuprofen, as needed for fever or bodyaches.  Provided work excuse.  Follow-up if symptoms do not improve, worsen or new symptoms occur. Final Clinical Impressions(s) / UC Diagnoses   Final diagnoses:  Fever, unspecified  Acute cough  Type A influenza     Discharge Instructions      Influenza testing not available today.  Patient has acute onset symptoms and exposure to influenza type A.  Will treat for influenza type a with Tamiflu, 75 mg, twice daily for 5 days.  Promethazine DM, 5 mL, every 6 hours, as needed for cough.  Get plenty of fluids and rest.  Follow-up if symptoms do not improve, worsen or new symptoms occur.     ED Prescriptions     Medication Sig Dispense Auth. Provider   oseltamivir (TAMIFLU) 75 MG capsule Take 1 capsule (75 mg total) by mouth every 12 (twelve) hours. 10 capsule Prescilla Sours, FNP   promethazine-dextromethorphan (PROMETHAZINE-DM) 6.25-15 MG/5ML syrup Take 5 mLs by mouth 4 (four) times daily as needed for cough. Do not use and drive - May make drowsy.  118 mL Prescilla Sours, FNP      PDMP not reviewed this encounter.   Prescilla Sours, FNP 12/22/23 [id number]

## 2023-12-22 NOTE — Discharge Instructions (Signed)
Influenza testing not available today.  Patient has acute onset symptoms and exposure to influenza type A.  Will treat for influenza type a with Tamiflu, 75 mg, twice daily for 5 days.  Promethazine DM, 5 mL, every 6 hours, as needed for cough.  Get plenty of fluids and rest.  Follow-up if symptoms do not improve, worsen or new symptoms occur.

## 2024-01-01 ENCOUNTER — Other Ambulatory Visit: Payer: Self-pay

## 2024-01-01 ENCOUNTER — Encounter (HOSPITAL_BASED_OUTPATIENT_CLINIC_OR_DEPARTMENT_OTHER): Payer: Self-pay | Admitting: Orthopaedic Surgery

## 2024-01-05 NOTE — H&P (Signed)
 PREOPERATIVE H&P  Chief Complaint: LEFT KNEE PATELLA DISLOCATION, SUBLUXATION, COMPARTMENT SYNDROME,SYNOVITIS, OSTEOCHONDRITIS DISSECANS  HPI: Daniel Gregory is a 23 y.o. male who is scheduled for, Procedure(s): KNEE RECONSTRUCTION SYNOVECTOMY FULKERSON SLIDE FASCIECTOMY, LOWER EXTREMITY OSTEOCHONDRAL DEFECT REPAIR/RECONSTRUCTION FEMUR OSTEOTOMY.   Patient is a healthy 23 year old who has had a right MPFL reconstruction with Dr. Dion Saucier.  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  He is a Occupational hygienist.  He is not able to do activities of daily living.  He has a sense of his knees coming out.    Symptoms are rated as moderate to severe, and have been worsening.  This is significantly impairing activities of daily living.    Please see clinic note for further details on this patient's care.    He has elected for surgical management.   Past Medical History:  Diagnosis Date   History of kidney stones    Past Surgical History:  Procedure Laterality Date   KNEE ARTHROSCOPY Right 2017   TONSILLECTOMY     UPPER GI ENDOSCOPY N/A 10/16/2021   Procedure: UPPER GI ENDOSCOPY;  Surgeon: Luretha Murphy, MD;  Location: [facility];  Service: General;  Laterality: N/A;   Social History   Socioeconomic History   Marital status: Single    Spouse name: Not on file   Number of children: Not on file   Years of education: Not on file   Highest education level: Not on file  Occupational History   Not on file  Tobacco Use   Smoking status: Never   Smokeless tobacco: Never  Vaping Use   Vaping status: Never Used  Substance and Sexual Activity   Alcohol use: Yes    Comment: rare   Drug use: Never   Sexual activity: Not on file  Other Topics Concern   Not on file  Social History Narrative   Not on file   Social Drivers of Health   Financial Resource Strain: Not on file  Food Insecurity: Not on file  Transportation Needs: Not on file  Physical Activity:  Not on file  Stress: Not on file  Social Connections: Not on file   History reviewed. No pertinent family history. No Known Allergies Prior to Admission medications   Not on File    ROS: All other systems have been reviewed and were otherwise negative with the exception of those mentioned in the HPI and as above.  Physical Exam: General: Alert, no acute distress Cardiovascular: No pedal edema Respiratory: No cyanosis, no use of accessory musculature GI: No organomegaly, abdomen is soft and non-tender Skin: No lesions in the area of chief complaint Neurologic: Sensation intact distally Psychiatric: Patient is competent for consent with normal mood and affect Lymphatic: No axillary or cervical lymphadenopathy  MUSCULOSKELETAL:  The range of motion of the knee is full. He has lateral apprehension and lateral translation of the patella.   Imaging: MRI which demonstrates a TT-TG of 20.  Dejour Type B trochlea with relatively preserved cartilage.  Signs of recent sequela of the patella dislocation with lateral tilt and lateral tracking of the patella.    Assessment: LEFT KNEE PATELLA DISLOCATION, SUBLUXATION, COMPARTMENT SYNDROME,SYNOVITIS, OSTEOCHONDRITIS DISSECANS  Plan: Plan for Procedure(s): KNEE RECONSTRUCTION SYNOVECTOMY FULKERSON SLIDE FASCIECTOMY, LOWER EXTREMITY OSTEOCHONDRAL DEFECT REPAIR/RECONSTRUCTION FEMUR OSTEOTOMY  The risks benefits and alternatives were discussed with the patient including but not limited to the risks of nonoperative treatment, versus surgical intervention including infection, bleeding, nerve injury,  blood  clots, cardiopulmonary complications, morbidity, mortality, among others, and they were willing to proceed.   The patient acknowledged the explanation, agreed to proceed with the plan and consent was signed.   Operative Plan: Left knee scope with MPFL reconstruction, trochleoplasty, and possible tibial tubercle osteotomy  Discharge  Medications: standard DVT Prophylaxis: aspirin Physical Therapy: outpatient PT Special Discharge needs: [place] (need to order). [employer]   Vernetta Honey, PA-C  01/05/2024 3:13 PM

## 2024-01-07 NOTE — Anesthesia Preprocedure Evaluation (Signed)
 Anesthesia Evaluation  Patient identified by MRN, date of birth, ID band Patient awake    Reviewed: Allergy & Precautions, NPO status , Patient's Chart, lab work & pertinent test results  Airway Mallampati: II  TM Distance: >3 FB     Dental  (+) Teeth Intact, Dental Advisory Given   Pulmonary neg pulmonary ROS   breath sounds clear to auscultation       Cardiovascular negative cardio ROS  Rhythm:Regular Rate:Normal     Neuro/Psych negative neurological ROS     GI/Hepatic Neg liver ROS,,,History noted Dr. Chilton Si   Endo/Other  negative endocrine ROS    Renal/GU negative Renal ROS     Musculoskeletal   Abdominal   Peds  Hematology   Anesthesia Other Findings   Reproductive/Obstetrics                             Anesthesia Physical Anesthesia Plan  ASA: 2  Anesthesia Plan: General   Post-op Pain Management: Minimal or no pain anticipated, Tylenol PO (pre-op)* and Celebrex PO (pre-op)*   Induction: Intravenous  PONV Risk Score and Plan: 3 and Ondansetron and Dexamethasone  Airway Management Planned: Oral ETT and LMA  Additional Equipment: None  Intra-op Plan:   Post-operative Plan: Possible Post-op intubation/ventilation  Informed Consent: I have reviewed the patients History and Physical, chart, labs and discussed the procedure including the risks, benefits and alternatives for the proposed anesthesia with the patient or authorized representative who has indicated his/her understanding and acceptance.     Dental advisory given  Plan Discussed with: CRNA and Anesthesiologist  Anesthesia Plan Comments:         Anesthesia Quick Evaluation

## 2024-01-07 NOTE — Discharge Instructions (Signed)
 Ramond Marrow MD, MPH Alfonse Alpers, PA-C [facility] [address] [phone] (tel)   [phone] (fax)   POST-OPERATIVE INSTRUCTIONS  WOUND CARE - You may remove the Operative Dressing on Post-Op Day #3 (72hrs after surgery).   - Alternatively if you would like you can leave dressing on until follow-up if within 7-8 days but keep it dry. - Leave steri-strips in place until they fall off on their own, usually 2 weeks postop. - An ACE wrap may be used to control swelling, do not wrap this too tight.  If the initial ACE wrap feels too tight you may loosen it. - There may be a small amount of fluid/bleeding leaking at the surgical site.  - This is normal; the knee is filled with fluid during the procedure and can leak for 24-48hrs after surgery.  - You may change/reinforce the bandage as needed.  - Use the Cryocuff or Ice as often as possible for the first 7 days, then as needed for pain relief. Always keep a towel, ACE wrap or other barrier between the cooling unit and your skin.  - You may shower on Post-Op Day #3. Gently pat the area dry.  - Do not soak the knee in water or submerge it.  - Do not go swimming in the pool or ocean until 4 weeks after surgery or when otherwise instructed.  Keep incisions as dry as possible.   BRACE/AMBULATION - You will be placed in a brace post-operatively.  - Wear your brace at all times until follow-up.  - You may remove for hygiene. -           Use crutches to help you ambulate -           Touch-down weight bearing: when you stand or walk, you may only touch your toes to the floor for balance -           Do NOT put any body weight on your leg  PHYSICAL THERAPY - You will begin physical therapy soon after surgery (unless otherwise specified) - Please call to set up an appointment, if you do not already have one  - Let our office if there are any issues with scheduling your therapy   - A PT referral was sent to  [facility]  REGIONAL ANESTHESIA (NERVE BLOCKS) The anesthesia team may have performed a nerve block for you this is a great tool used to minimize pain.   The block may start wearing off overnight (between 8-24 hours postop) When the block wears off, your pain may go from nearly zero to the pain you would have had postop without the block. This is an abrupt transition but nothing dangerous is happening.   This can be a challenging period but utilize your as needed pain medications to try and manage this period. We suggest you use the pain medication the first night prior to going to bed, to ease this transition.  You may take an extra dose of narcotic when this happens if needed  POST-OP MEDICATIONS- Multimodal approach to pain control In general your pain will be controlled with a combination of substances.  Prescriptions unless otherwise discussed are electronically sent to your pharmacy.  This is a carefully made plan we use to minimize narcotic use.     Meloxicam - Anti-inflammatory medication taken on a scheduled basis Acetaminophen - Non-narcotic pain medicine taken on a scheduled basis  Oxycodone - This is a strong narcotic, to  be used only on an "as needed" basis for SEVERE pain. Aspirin 81mg  - This medicine is used to minimize the risk of blood clots after surgery. Robaxin - this is a muscle relaxer, take as needed for muscle spasms Zofran - take as needed for nausea    FOLLOW-UP   Please call the office to schedule a follow-up appointment for your incision check, 7-10 days post-operatively.  IF YOU HAVE ANY QUESTIONS, PLEASE FEEL FREE TO CALL OUR OFFICE.   HELPFUL INFORMATION    Keep your leg elevated to decrease swelling, which will then in turn decrease your pain. I would elevate the foot of your bed by putting a couple of couch pillows between your mattress and box spring. I would not keep pillow directly under your ankle.  - Do not sleep with a pillow behind  your knee even if it is more comfortable as this may make it harder to get your knee fully straight long term.   There will be MORE swelling on days 1-3 than there is on the day of surgery.  This also is normal. The swelling will decrease with the anti-inflammatory medication, ice and keeping it elevated. The swelling will make it more difficult to bend your knee. As the swelling goes down your motion will become easier   You may develop swelling and bruising that extends from your knee down to your calf and perhaps even to your foot over the next week. Do not be alarmed. This too is normal, and it is due to gravity   There may be some numbness adjacent to the incision site. This may last for 6-12 months or longer in some patients and is expected.   You may return to sedentary work/school in the next couple of days when you feel up to it. You will need to keep your leg elevated as much as possible    You should wean off your narcotic medicines as soon as you are able.  Most patients will be off narcotics before their first postop appointment.    We suggest you use the pain medication the first night prior to going to bed, in order to ease any pain when the anesthesia wears off. You should avoid taking pain medications on an empty stomach as it will make you nauseous.   Do not drink alcoholic beverages or take illicit drugs when taking pain medications.   It is against the law to drive while taking narcotics. You cannot drive if your Right leg is in brace locked in extension.   Pain medication may make you constipated.  Below are a few solutions to try in this order:  o Decrease the amount of pain medication if you aren't having pain.  o Drink lots of decaffeinated fluids.  o Drink prune juice and/or each dried prunes   o If the first 3 don't work start with additional solutions  o Take Colace - an over-the-counter stool softener  o Take Senokot - an over-the-counter  laxative  o Take Miralax - a stronger over-the-counter laxative   For more information including helpful videos and documents visit our website:   [email or web address]

## 2024-01-08 ENCOUNTER — Ambulatory Visit (HOSPITAL_BASED_OUTPATIENT_CLINIC_OR_DEPARTMENT_OTHER): Payer: Self-pay | Admitting: Certified Registered"

## 2024-01-08 ENCOUNTER — Ambulatory Visit (HOSPITAL_BASED_OUTPATIENT_CLINIC_OR_DEPARTMENT_OTHER)
Admission: RE | Admit: 2024-01-08 | Discharge: 2024-01-08 | Disposition: A | Discharge status: Home / Self Care | Payer: Medicaid Other | Attending: Orthopaedic Surgery | Admitting: Orthopaedic Surgery

## 2024-01-08 ENCOUNTER — Ambulatory Visit (HOSPITAL_BASED_OUTPATIENT_CLINIC_OR_DEPARTMENT_OTHER): Payer: Medicaid Other

## 2024-01-08 ENCOUNTER — Encounter (HOSPITAL_BASED_OUTPATIENT_CLINIC_OR_DEPARTMENT_OTHER): Payer: Self-pay | Admitting: Orthopaedic Surgery

## 2024-01-08 ENCOUNTER — Encounter (HOSPITAL_BASED_OUTPATIENT_CLINIC_OR_DEPARTMENT_OTHER)
Admission: RE | Disposition: A | Discharge status: Home / Self Care | Payer: Self-pay | Source: Home / Self Care | Attending: Orthopaedic Surgery

## 2024-01-08 ENCOUNTER — Other Ambulatory Visit: Payer: Self-pay

## 2024-01-08 ENCOUNTER — Other Ambulatory Visit (HOSPITAL_COMMUNITY): Payer: Self-pay

## 2024-01-08 DIAGNOSIS — M2352 Chronic instability of knee, left knee: Secondary | ICD-10-CM | POA: Diagnosis present

## 2024-01-08 DIAGNOSIS — M25362 Other instability, left knee: Secondary | ICD-10-CM | POA: Diagnosis not present

## 2024-01-08 HISTORY — PX: KNEE RECONSTRUCTION: SHX5883

## 2024-01-08 HISTORY — PX: TIBIA OSTEOTOMY: SHX1065

## 2024-01-08 HISTORY — PX: SYNOVECTOMY: SHX5180

## 2024-01-08 HISTORY — PX: OSTEOCHONDRAL DEFECT REPAIR/RECONSTRUCTION: SHX6232

## 2024-01-08 SURGERY — RECONSTRUCTION, KNEE
Anesthesia: General | Site: Knee | Laterality: Left

## 2024-01-08 MED ORDER — ACETAMINOPHEN 500 MG PO TABS
ORAL_TABLET | ORAL | Status: AC
  Filled 2024-01-08: qty 2

## 2024-01-08 MED ORDER — OXYCODONE HCL 5 MG/5ML PO SOLN: 5.0000 mg | Freq: Once | ORAL | Status: AC | PRN

## 2024-01-08 MED ORDER — OXYCODONE HCL 5 MG PO TABS
ORAL_TABLET | ORAL | Status: AC
  Filled 2024-01-08: qty 1

## 2024-01-08 MED ORDER — CEFAZOLIN SODIUM-DEXTROSE 2-4 GM/100ML-% IV SOLN
INTRAVENOUS | Status: AC
  Filled 2024-01-08: qty 100

## 2024-01-08 MED ORDER — FENTANYL CITRATE (PF) 100 MCG/2ML IJ SOLN
INTRAMUSCULAR | Status: AC
  Filled 2024-01-08: qty 2

## 2024-01-08 MED ORDER — PROPOFOL 10 MG/ML IV BOLUS
INTRAVENOUS | Status: AC
  Filled 2024-01-08: qty 20

## 2024-01-08 MED ORDER — ACETAMINOPHEN 160 MG/5ML PO SOLN: 325.0000 mg | ORAL | Status: DC | PRN

## 2024-01-08 MED ORDER — OXYCODONE HCL 5 MG/5ML PO SOLN: 5.0000 mg | Freq: Once | ORAL | Status: DC | PRN

## 2024-01-08 MED ORDER — LACTATED RINGERS IV SOLN: INTRAVENOUS | Status: DC

## 2024-01-08 MED ORDER — ACETAMINOPHEN 500 MG PO TABS
1000.0000 mg | ORAL_TABLET | Freq: Three times a day (TID) | ORAL | 0 refills | Status: AC
  Filled 2024-01-08: qty 84, 14d supply, fill #0
  Filled 2024-01-08: qty 100, 17d supply, fill #0

## 2024-01-08 MED ORDER — GABAPENTIN 300 MG PO CAPS
ORAL_CAPSULE | ORAL | Status: AC
  Filled 2024-01-08: qty 1

## 2024-01-08 MED ORDER — MIDAZOLAM HCL 5 MG/5ML IJ SOLN
INTRAMUSCULAR | Status: DC | PRN
  Administered 2024-01-08: 2 mg via INTRAVENOUS

## 2024-01-08 MED ORDER — OXYCODONE HCL 5 MG PO TABS
5.0000 mg | ORAL_TABLET | Freq: Once | ORAL | Status: AC | PRN
  Administered 2024-01-08: 5 mg via ORAL

## 2024-01-08 MED ORDER — SODIUM CHLORIDE 0.9 % IR SOLN
Status: DC | PRN
  Administered 2024-01-08: 3000 mL

## 2024-01-08 MED ORDER — MIDAZOLAM HCL 2 MG/2ML IJ SOLN
INTRAMUSCULAR | Status: AC
  Filled 2024-01-08: qty 2

## 2024-01-08 MED ORDER — OXYCODONE HCL 5 MG PO TABS
5.0000 mg | ORAL_TABLET | Freq: Four times a day (QID) | ORAL | 0 refills | Status: AC | PRN
  Filled 2024-01-08: qty 30, 4d supply, fill #0

## 2024-01-08 MED ORDER — MEPERIDINE HCL 25 MG/ML IJ SOLN: 6.2500 mg | INTRAMUSCULAR | Status: DC | PRN

## 2024-01-08 MED ORDER — CELECOXIB 200 MG PO CAPS
ORAL_CAPSULE | ORAL | Status: AC
  Filled 2024-01-08: qty 1

## 2024-01-08 MED ORDER — BUPIVACAINE HCL (PF) 0.25 % IJ SOLN
INTRAMUSCULAR | Status: DC | PRN
  Administered 2024-01-08: 30 mL

## 2024-01-08 MED ORDER — ACETAMINOPHEN 325 MG PO TABS: 325.0000 mg | ORAL_TABLET | ORAL | Status: DC | PRN

## 2024-01-08 MED ORDER — ONDANSETRON HCL 4 MG/2ML IJ SOLN: 4.0000 mg | Freq: Once | INTRAMUSCULAR | Status: DC | PRN

## 2024-01-08 MED ORDER — LIDOCAINE 2% (20 MG/ML) 5 ML SYRINGE
INTRAMUSCULAR | Status: AC
  Filled 2024-01-08: qty 5

## 2024-01-08 MED ORDER — VANCOMYCIN HCL 1 G IV SOLR
INTRAVENOUS | Status: DC | PRN
  Administered 2024-01-08: 1000 mg via TOPICAL

## 2024-01-08 MED ORDER — CELECOXIB 200 MG PO CAPS
200.0000 mg | ORAL_CAPSULE | Freq: Once | ORAL | Status: AC
  Administered 2024-01-08: 200 mg via ORAL

## 2024-01-08 MED ORDER — METHOCARBAMOL 500 MG PO TABS
500.0000 mg | ORAL_TABLET | Freq: Three times a day (TID) | ORAL | 0 refills | Status: AC | PRN
  Filled 2024-01-08: qty 30, 10d supply, fill #0

## 2024-01-08 MED ORDER — DEXAMETHASONE SODIUM PHOSPHATE 4 MG/ML IJ SOLN
INTRAMUSCULAR | Status: DC | PRN
  Administered 2024-01-08: 5 mg via INTRAVENOUS

## 2024-01-08 MED ORDER — FENTANYL CITRATE (PF) 100 MCG/2ML IJ SOLN: 25.0000 ug | INTRAMUSCULAR | Status: DC | PRN

## 2024-01-08 MED ORDER — ONDANSETRON HCL 4 MG/2ML IJ SOLN
INTRAMUSCULAR | Status: DC | PRN
Start: 2024-01-08 — End: 2024-01-08
  Administered 2024-01-08: 4 mg via INTRAVENOUS

## 2024-01-08 MED ORDER — LIDOCAINE 2% (20 MG/ML) 5 ML SYRINGE
INTRAMUSCULAR | Status: DC | PRN
  Administered 2024-01-08: 100 mg via INTRAVENOUS

## 2024-01-08 MED ORDER — ASPIRIN 81 MG PO CHEW
81.0000 mg | CHEWABLE_TABLET | Freq: Two times a day (BID) | ORAL | 0 refills | Status: AC
  Filled 2024-01-08: qty 90, 45d supply, fill #0
  Filled 2024-01-08: qty 84, 42d supply, fill #0

## 2024-01-08 MED ORDER — ACETAMINOPHEN 500 MG PO TABS
1000.0000 mg | ORAL_TABLET | Freq: Once | ORAL | Status: AC
  Administered 2024-01-08: 1000 mg via ORAL

## 2024-01-08 MED ORDER — CEFAZOLIN SODIUM-DEXTROSE 2-4 GM/100ML-% IV SOLN
2.0000 g | INTRAVENOUS | Status: AC
  Administered 2024-01-08: 2 g via INTRAVENOUS

## 2024-01-08 MED ORDER — ACETAMINOPHEN 500 MG PO TABS: 1000.0000 mg | ORAL_TABLET | Freq: Once | ORAL | Status: AC

## 2024-01-08 MED ORDER — OXYCODONE HCL 5 MG PO TABS: 5.0000 mg | ORAL_TABLET | Freq: Once | ORAL | Status: DC | PRN

## 2024-01-08 MED ORDER — PROPOFOL 10 MG/ML IV BOLUS
INTRAVENOUS | Status: DC | PRN
Start: 2024-01-08 — End: 2024-01-08
  Administered 2024-01-08 (×2): 50 mg via INTRAVENOUS

## 2024-01-08 MED ORDER — FENTANYL CITRATE (PF) 100 MCG/2ML IJ SOLN
INTRAMUSCULAR | Status: DC | PRN
  Administered 2024-01-08 (×2): 50 ug via INTRAVENOUS
  Administered 2024-01-08 (×2): 25 ug via INTRAVENOUS
  Administered 2024-01-08: 50 ug via INTRAVENOUS
  Administered 2024-01-08: 100 ug via INTRAVENOUS

## 2024-01-08 MED ORDER — FENTANYL CITRATE (PF) 100 MCG/2ML IJ SOLN
25.0000 ug | INTRAMUSCULAR | Status: DC | PRN
  Administered 2024-01-08 (×2): 50 ug via INTRAVENOUS

## 2024-01-08 MED ORDER — ONDANSETRON HCL 4 MG/2ML IJ SOLN
INTRAMUSCULAR | Status: AC
  Filled 2024-01-08: qty 2

## 2024-01-08 MED ORDER — GABAPENTIN 300 MG PO CAPS
300.0000 mg | ORAL_CAPSULE | Freq: Once | ORAL | Status: AC
  Administered 2024-01-08: 300 mg via ORAL

## 2024-01-08 MED ORDER — DEXAMETHASONE SODIUM PHOSPHATE 10 MG/ML IJ SOLN
INTRAMUSCULAR | Status: AC
  Filled 2024-01-08: qty 1

## 2024-01-08 MED ORDER — ONDANSETRON HCL 4 MG PO TABS
4.0000 mg | ORAL_TABLET | Freq: Three times a day (TID) | ORAL | 0 refills | Status: AC | PRN
  Filled 2024-01-08: qty 10, 4d supply, fill #0

## 2024-01-08 MED ORDER — EPINEPHRINE (ANAPHYLAXIS) 30 MG/30ML IJ SOLN
INTRAMUSCULAR | Status: DC | PRN
  Administered 2024-01-08: 1 mg

## 2024-01-08 MED ORDER — MELOXICAM 7.5 MG PO TABS
7.5000 mg | ORAL_TABLET | Freq: Two times a day (BID) | ORAL | 0 refills | Status: AC
  Filled 2024-01-08: qty 60, 30d supply, fill #0

## 2024-01-08 SURGICAL SUPPLY — 76 items
ANCH DBL 2.6 SLF-PNCH FIBERTAK (Anchor) ×6 IMPLANT
ANCHOR DBL 2.6 SLF-PNCH FIBRTK (Anchor) IMPLANT
ANCHOR PSHLK BCMP PEK 3.5X19.5 (Anchor) IMPLANT
BANDAGE ESMARK 6X9 LF (GAUZE/BANDAGES/DRESSINGS) IMPLANT
BLADE AVERAGE 25X9 (BLADE) IMPLANT
BLADE MICRO SAGITTAL (BLADE) IMPLANT
BLADE SAGITTAL 25.0X1.27X90 (BLADE) IMPLANT
BLADE SHAVER BONE 5.0X13 (MISCELLANEOUS) IMPLANT
BLADE SURG 10 STRL SS (BLADE) ×2 IMPLANT
BLADE SURG 15 STRL LF DISP TIS (BLADE) ×4 IMPLANT
BNDG ELASTIC 4INX 5YD STR LF (GAUZE/BANDAGES/DRESSINGS) ×2 IMPLANT
BNDG ELASTIC 6INX 5YD STR LF (GAUZE/BANDAGES/DRESSINGS) ×2 IMPLANT
BNDG ESMARK 6X9 LF (GAUZE/BANDAGES/DRESSINGS) IMPLANT
BURR OVAL 8 FLU 4.0X13 (MISCELLANEOUS) IMPLANT
CANISTER SUCT 1200ML W/VALVE (MISCELLANEOUS) IMPLANT
CHLORAPREP W/TINT 26 (MISCELLANEOUS) ×2 IMPLANT
CLSR STERI-STRIP ANTIMIC 1/2X4 (GAUZE/BANDAGES/DRESSINGS) ×2 IMPLANT
COOLER ICEMAN CLASSIC (MISCELLANEOUS) ×2 IMPLANT
COVER BACK TABLE 60X90IN (DRAPES) ×2 IMPLANT
CUFF TRNQT CYL 34X4.125X (TOURNIQUET CUFF) ×2 IMPLANT
DRAPE C-ARM 42X72 X-RAY (DRAPES) ×2 IMPLANT
DRAPE C-ARMOR (DRAPES) ×2 IMPLANT
DRAPE EXTREMITY T 121X128X90 (DISPOSABLE) ×2 IMPLANT
DRAPE IMP U-DRAPE 54X76 (DRAPES) IMPLANT
DRAPE INCISE IOBAN 66X45 STRL (DRAPES) IMPLANT
DRAPE OEC MINIVIEW 54X84 (DRAPES) IMPLANT
DRAPE U-SHAPE 47X51 STRL (DRAPES) ×2 IMPLANT
DRAPE U-SHAPE 76X120 STRL (DRAPES) IMPLANT
DRAPE-T ARTHROSCOPY W/POUCH (DRAPES) ×2 IMPLANT
DRSG AQUACEL AG ADV 3.5X 6 (GAUZE/BANDAGES/DRESSINGS) ×2 IMPLANT
ELECT REM PT RETURN 9FT ADLT (ELECTROSURGICAL) ×2 IMPLANT
ELECTRODE REM PT RTRN 9FT ADLT (ELECTROSURGICAL) ×2 IMPLANT
GAUZE PAD ABD 8X10 STRL (GAUZE/BANDAGES/DRESSINGS) IMPLANT
GAUZE SPONGE 4X4 12PLY STRL (GAUZE/BANDAGES/DRESSINGS) ×4 IMPLANT
GLOVE BIO SURGEON STRL SZ 6.5 (GLOVE) ×2 IMPLANT
GLOVE BIOGEL PI IND STRL 6.5 (GLOVE) ×2 IMPLANT
GLOVE BIOGEL PI IND STRL 8 (GLOVE) ×2 IMPLANT
GLOVE ECLIPSE 8.0 STRL XLNG CF (GLOVE) ×2 IMPLANT
GOWN STRL REUS W/ TWL LRG LVL3 (GOWN DISPOSABLE) ×4 IMPLANT
GOWN STRL REUS W/TWL XL LVL3 (GOWN DISPOSABLE) ×4 IMPLANT
GRAFT TISS SEMITEND 4-8 (Bone Implant) IMPLANT
IMMOBILIZER KNEE 22 UNIV (SOFTGOODS) IMPLANT
KIT BURR TROCHL W/2.9 BURR (KITS) IMPLANT
KIT KNEE FIBERTAK DISP (KITS) IMPLANT
MANIFOLD NEPTUNE II (INSTRUMENTS) ×2 IMPLANT
NDL SUT 6 .5 CRC .975X.05 MAYO (NEEDLE) ×2 IMPLANT
NS IRRIG 1000ML POUR BTL (IV SOLUTION) ×2 IMPLANT
PACK ARTHROSCOPY DSU (CUSTOM PROCEDURE TRAY) ×2 IMPLANT
PACK BASIN DAY SURGERY FS (CUSTOM PROCEDURE TRAY) ×2 IMPLANT
PAD CAST 4YDX4 CTTN HI CHSV (CAST SUPPLIES) IMPLANT
PAD COLD SHLDR WRAP-ON (PAD) ×2 IMPLANT
PADDING CAST COTTON 6X4 STRL (CAST SUPPLIES) IMPLANT
PENCIL SMOKE EVACUATOR (MISCELLANEOUS) ×2 IMPLANT
SET HNDPC FAN SPRY TIP SCT (DISPOSABLE) ×2 IMPLANT
SHEET MEDIUM DRAPE 40X70 STRL (DRAPES) IMPLANT
SLEEVE SCD COMPRESS KNEE MED (STOCKING) IMPLANT
SPIKE FLUID TRANSFER (MISCELLANEOUS) IMPLANT
SPONGE T-LAP 18X18 ~~LOC~~+RFID (SPONGE) ×2 IMPLANT
SPONGE T-LAP 4X18 ~~LOC~~+RFID (SPONGE) ×2 IMPLANT
SUCTION TUBE FRAZIER 10FR DISP (SUCTIONS) IMPLANT
SUT FIBERWIRE #2 38 T-5 BLUE (SUTURE) IMPLANT
SUT MNCRL AB 4-0 PS2 18 (SUTURE) ×2 IMPLANT
SUT VIC AB 0 CT1 27XBRD ANBCTR (SUTURE) ×2 IMPLANT
SUT VIC AB 0 CT1 27XCR 8 STRN (SUTURE) IMPLANT
SUT VIC AB 1 CT1 27XBRD ANBCTR (SUTURE) IMPLANT
SUT VIC AB 2-0 SH 27XBRD (SUTURE) IMPLANT
SUT VIC AB 3-0 SH 27X BRD (SUTURE) ×2 IMPLANT
SUT VICRYL 0 SH 27 (SUTURE) IMPLANT
SUTURE FIBERWR #2 38 T-5 BLUE (SUTURE) IMPLANT
SYR BULB IRRIG 60ML STRL (SYRINGE) ×2 IMPLANT
TENDON SEMI-TENDINOSUS (Bone Implant) ×2 IMPLANT
TOWEL GREEN STERILE FF (TOWEL DISPOSABLE) ×6 IMPLANT
TUBE SUCTION HIGH CAP CLEAR NV (SUCTIONS) ×2 IMPLANT
TUBING ARTHROSCOPY IRRIG 16FT (MISCELLANEOUS) ×2 IMPLANT
WAND ABLATOR APOLLO I90 (BUR) IMPLANT
WRAP KNEE MAXI GEL POST OP (GAUZE/BANDAGES/DRESSINGS) ×2 IMPLANT

## 2024-01-08 NOTE — Anesthesia Postprocedure Evaluation (Signed)
 Anesthesia Post Note  Patient: Daniel Gregory  Procedure(s) Performed: KNEE RECONSTRUCTION (Left: Knee) SYNOVECTOMY (Left) OSTEOCHONDRAL DEFECT REPAIR/RECONSTRUCTION (Left) FEMUR OSTEOTOMY (Left)     Patient location during evaluation: PACU Anesthesia Type: General Level of consciousness: awake and alert Pain management: pain level controlled Vital Signs Assessment: post-procedure vital signs reviewed and stable Respiratory status: spontaneous breathing, nonlabored ventilation, respiratory function stable and patient connected to nasal cannula oxygen Cardiovascular status: blood pressure returned to baseline and stable Postop Assessment: no apparent nausea or vomiting Anesthetic complications: no   No notable events documented.  Last Vitals:  Vitals:   01/08/24 1200 01/08/24 1207  BP:  (!) 146/91  Pulse: 99   Resp: 15 16  Temp:  36.7 C  SpO2: 99% 98%    Last Pain:  Vitals:   01/08/24 1207  TempSrc:   PainSc: 6                  Caledonia Zou

## 2024-01-08 NOTE — Anesthesia Procedure Notes (Signed)
 Procedure Name: LMA Insertion Date/Time: 01/08/2024 8:58 AM  Performed by: Demetrio Lapping, CRNAPre-anesthesia Checklist: Patient identified, Emergency Drugs available, Suction available and Patient being monitored Patient Re-evaluated:Patient Re-evaluated prior to induction Oxygen Delivery Method: Circle System Utilized Preoxygenation: Pre-oxygenation with 100% oxygen Induction Type: IV induction Ventilation: Mask ventilation without difficulty LMA: LMA inserted LMA Size: 5.0 Number of attempts: 1 Airway Equipment and Method: Bite block Placement Confirmation: positive ETCO2 Tube secured with: Tape Dental Injury: Teeth and Oropharynx as per pre-operative assessment

## 2024-01-08 NOTE — Transfer of Care (Signed)
 Immediate Anesthesia Transfer of Care Note  Patient: Daniel Gregory  Procedure(s) Performed: Procedure(s) (LRB): KNEE RECONSTRUCTION (Left) SYNOVECTOMY (Left) OSTEOCHONDRAL DEFECT REPAIR/RECONSTRUCTION (Left) FEMUR OSTEOTOMY (Left)  Patient Location: PACU  Anesthesia Type: GA  Level of Consciousness: awake, sedated, patient cooperative and responds to stimulation, c/o pain in back - comfort measures given w/ medication   Airway & Oxygen Therapy: Patient Spontanous Breathing and Patient connected to [place] oxygen  Post-op Assessment: Report given to PACU RN, Post -op Vital signs reviewed and stable and Patient moving all extremities  Post vital signs: Reviewed and stable  Complications: No apparent anesthesia complications

## 2024-01-08 NOTE — Op Note (Signed)
 Orthopaedic Surgery Operative Note (CSN: [id number])  Daniel Gregory  Jun 27, 2001 Date of Surgery: 01/08/2024   Diagnoses:  Left recurrent patellofemoral instability  Procedure: Left MPFL reconstruction Left knee chondroplasty Left distal femoral osteotomy with osteochondral autograft transplantation (trochleoplasty)   Operative Finding Patient had an easily dislocatable patella preoperatively, he had some grade 1 changes to the patella and some fissuring.  The trochlea was intact but was dejour type D.  There was a supratrochlear spur.  We performed a 11 mm correction of trochlear position and an MPFL reconstruction.  Successful completion of the planned procedure.    Post-operative plan: The patient will be weightbearing to tolerance and hinged knee brace following trochlear plasty protocol.  The patient will be discharged home.  DVT prophylaxis Aspirin 81 mg twice daily for 6 weeks.  Pain control with PRN pain medication preferring oral medicines.  Follow up plan will be scheduled in approximately 7 days for incision check and XR.  Post-Op Diagnosis: Same Surgeons:Primary: Bjorn Pippin, MD Assistants: Darron Doom, RN-FA, Alfonse Alpers, PA-C Location: [facility] OR ROOM 6 Anesthesia: General with local Antibiotics: Ancef 2 g with local vancomycin powder 1 g at the surgical site Tourniquet time:  Total Tourniquet Time Documented: Thigh (Left) - 87 minutes Total: Thigh (Left) - 87 minutes  Estimated Blood Loss: Minimal Complications: None Specimens: None Implants: Implant Name Type Inv. Item Serial No. Manufacturer Lot No. LRB No. Used Action  ANCHOR SUT PUSHLK 3.5X19.5 BI - [id number] Anchor ANCHOR SUT PUSHLK 3.5X19.5 BI  [employer] [id number] Left 1 Implanted  ANCH DBL 2.6 SLF-PNCH FIBERTAK - [id number] Anchor ANCH DBL 2.6 SLF-PNCH FIBERTAK  [employer] [id number] Left 1 Implanted  ANCH DBL 2.6 SLF-PNCH FIBERTAK - [id number] Anchor ANCH DBL 2.6 SLF-PNCH FIBERTAK  [employer] [id number]  Left 1 Implanted  TENDON SEMI-TENDINOSUS - [id number] Bone Implant TENDON SEMI-TENDINOSUS [id number] [facility] [id number] Left 1 Implanted  [facility] DBL 2.6 SLF-PNCH FIBERTAK - [id number] Anchor ANCH DBL 2.6 SLF-PNCH FIBERTAK  [employer] [id number] Left 1 Implanted  ANCHOR SUT PUSHLK 3.5X19.5 BI - [id number] Anchor ANCHOR SUT PUSHLK 3.5X19.5 BI  [employer] [id number] Left 1 Implanted  ANCHOR SUT PUSHLK 3.5X19.5 BI - [id number] Anchor ANCHOR SUT PUSHLK 3.5X19.5 BI  [employer] [id number] Left 1 Implanted  ANCHOR SUT PUSHLK 3.5X19.5 BI - [id number] Anchor ANCHOR SUT PUSHLK 3.5X19.5 BI  [employer] [id number] Left 1 Implanted  ANCHOR SUT PUSHLK 3.5X19.5 BI - [id number] Anchor ANCHOR SUT PUSHLK 3.5X19.5 [employer] [id number] Left 1 Implanted    Indications for Surgery:   Daniel Gregory is a 23 y.o. male with recurrent patellofemoral instability and trochlear dysplasia.  Benefits and risks of operative and nonoperative management were discussed prior to surgery with patient/guardian(s) and informed consent form was completed.  Specific risks including infection, need for additional surgery, recurrent instability, stiffness amongst others.   Procedure:   The patient was identified properly. Informed consent was obtained and the surgical site was marked. The patient was taken up to suite where general anesthesia was induced. The patient was placed in the supine position with a post against the surgical leg and a nonsterile tourniquet applied. The surgical leg was then prepped and draped usual sterile fashion.  A standard surgical timeout was performed.  2 standard anterior portals were made and diagnostic arthroscopy performed. Please note the findings as noted above.  We cleared the joint as above.  Gentle chondroplasty performed of the patella.  Synovectomy performed of the anterior, medial and lateral compartments.  Attention was turned to the proximal medial patella where a proximal medial  patellar skin incision was made and carried down through the skin and subcutaneous tissue.  The medial border of the patella was exposed down to layer 3.  We tagged the superficial tissue which was consistent with the attenuated MPFL remnant.  The joint was not entered.  We then used 2 - 2.6 mm arthrex fiber tack anchor placed at the proximal 25% and 50% marks of the patella from proximal to distal transversely.  These would be used to hold our graft in place using a luggage loop type suture pass.    Our graft was prepped in the form of a doubled over semitendinosus that passed through a 7.55mm tunnel.   This was secured as above to the patella at its mid portion and the two loose tails were then passed under layer 2 to the medial epicondyle.  We then made a 3 cm approach starting at the medial epicondyle extending just proximal and posterior.  We took care to dissect the superficial tissues bluntly and used blunt retraction to ensure that the neurovascular structures were out of our field.   We identified the medial epicondyle.  Blunt dissection was performed below the fascia outside of the capsule from the medial patella to the adductor tubercle.    Using a  pin under fluoroscopy image intensification, the  pin was placed at [place] and placed from a posterior to anterior and distal to proximal direction.  Good position was noted on the fluoroscopic views.  We placed a 2.6 fiber tack anchor in this position.  Attention was turned to the trochleoplasty.  We extended our lateral portal incision longitudinally.  Went through skin sharply chief hemostasis we progressed.  We identified the lateral retinaculum and made a small cuff off the lateral border the patella.  Went through the fascial layer without issue.  At that point we were able to identify the joint.  We performed a series of releases in order to protect the patellar tendon and dislocate the patella medially.  We used a 4 mm Steinmann pin to  hold in this position.  That point we identified that the trochlea was appropriate for osteotomy.  We used a series of osteotome and a bur device from [employer] to create a distal femoral osteotomy and elevate the anterior portion of the trochlea.  We then were able to contour the distal femur and reshape the area transplanting this cartilage into the newly formed groove.  This was essentially performing a distal femoral osteotomy and osteochondral autograft transplant.  We had to fix her transplanted cartilage using a series of three 3.2 mm peek push locks in the new groove which was created about 1 cm deep and 1 cm lateral to the original groove.  We Daisy chain linked #1 Vicryl and used 4 strands in the central groove portion of the fixation.  We bone grafted laterally to create a lateral bumper underneath our graft.  We then brought to additional 3.2 push locks over the far lateral and far medial aspect of the flap.  We created a deep well-fixed groove with good centering of the patella through range of motion.  Attention was turned back to the MPFL.  We shuttled our sutures from the patellar incision to the femoral incision.  We then were able to use a 2.6 fiber tack anchor to secure with an onlay staple type configuration the graft in the appropriate position.  With the  knee in 30 degrees of flexion, the graft was appropriately tensioned to allow for appropriate medial lateral stability with approximately 10mm of lateral translation without being excessively tight.  Excellent tension was noted.    There was adequate medial lateral stability, but the patella was not excessively tight.  The arthroscope was placed back in the joint to check position and translation of the patella before and after graft fixation noting it to be stable and articulating within the trochlea.  The native MPFL tissue was repaired at both its patellar and femoral origins in a pants over vest style fashion to imbricate this loose  tissue with 0 Vicryl.  All incisions were irrigated copiously and vancomycin powder was placed prior to closure in a multilayer fashion with absorbable suture.  Lateral incision was closed taking care to not over tension the lateral retinaculum.  Finishing layers were absorbable suture.  Sterile dressing and knee immobilizer hinge were placed locked in extension.  Patient was woken and taken to PACU in stable condition.   Incisions closed with absorbable suture. The patient was awoken from general anesthesia and taken to the PACU in stable condition without complication.   Alfonse Alpers, PA-C, present and scrubbed throughout the case, critical for completion in a timely fashion, and for retraction, instrumentation, closure.

## 2024-01-08 NOTE — Interval H&P Note (Signed)
 All questions answered, patient wants to proceed with procedure. ? ?

## 2024-01-09 ENCOUNTER — Encounter (HOSPITAL_BASED_OUTPATIENT_CLINIC_OR_DEPARTMENT_OTHER): Payer: Self-pay | Admitting: Orthopaedic Surgery

## 2024-01-20 ENCOUNTER — Encounter: Payer: Self-pay | Admitting: Physical Therapy

## 2024-01-20 ENCOUNTER — Other Ambulatory Visit: Payer: Self-pay

## 2024-01-20 ENCOUNTER — Ambulatory Visit: Attending: Orthopaedic Surgery | Admitting: Physical Therapy

## 2024-01-20 DIAGNOSIS — M6281 Muscle weakness (generalized): Secondary | ICD-10-CM | POA: Diagnosis present

## 2024-01-20 DIAGNOSIS — M25662 Stiffness of left knee, not elsewhere classified: Secondary | ICD-10-CM | POA: Insufficient documentation

## 2024-01-20 DIAGNOSIS — R2689 Other abnormalities of gait and mobility: Secondary | ICD-10-CM | POA: Diagnosis present

## 2024-01-20 NOTE — Therapy (Signed)
 OUTPATIENT PHYSICAL THERAPY LOWER EXTREMITY EVALUATION   Patient Name: Daniel Gregory MRN: [id number] DOB:[birth year], 23 y.o., male Today's Date: 01/20/2024  END OF SESSION:  PT End of Session - 01/20/24 1642     Visit Number 1    Number of Visits 24    Date for PT Re-Evaluation 04/13/24    Authorization Type MCD    PT Start Time 1445    PT Stop Time 1530    PT Time Calculation (min) 45 min    Activity Tolerance Patient tolerated treatment well    Behavior During Therapy [facility] for tasks assessed/performed             Past Medical History:  Diagnosis Date   History of kidney stones    Past Surgical History:  Procedure Laterality Date   KNEE ARTHROSCOPY Right 2017   KNEE RECONSTRUCTION Left 01/08/2024   Procedure: KNEE RECONSTRUCTION;  Surgeon: Bjorn Pippin, MD;  Location: [facility];  Service: Orthopedics;  Laterality: Left;   OSTEOCHONDRAL DEFECT REPAIR/RECONSTRUCTION Left 01/08/2024   Procedure: OSTEOCHONDRAL DEFECT REPAIR/RECONSTRUCTION;  Surgeon: Bjorn Pippin, MD;  Location: [facility];  Service: Orthopedics;  Laterality: Left;   SYNOVECTOMY Left 01/08/2024   Procedure: SYNOVECTOMY;  Surgeon: Bjorn Pippin, MD;  Location: [facility];  Service: Orthopedics;  Laterality: Left;   TIBIA OSTEOTOMY Left 01/08/2024   Procedure: FEMUR OSTEOTOMY;  Surgeon: Bjorn Pippin, MD;  Location: [facility];  Service: Orthopedics;  Laterality: Left;   TONSILLECTOMY     UPPER GI ENDOSCOPY N/A 10/16/2021   Procedure: UPPER GI ENDOSCOPY;  Surgeon: Luretha Murphy, MD;  Location: [facility];  Service: General;  Laterality: N/A;   Patient Active Problem List   Diagnosis Date Noted   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   Abdominal pain     PCP: Gordan Payment, MD  REFERRING PROVIDER: Bjorn Pippin, MD  REFERRING DIAG: Left knee arthroscopy with MPFL reconstruction and trochleoplasty 2/20   THERAPY DIAG:  Stiffness of left  knee, not elsewhere classified  Muscle weakness (generalized)  Other abnormalities of gait and mobility  Rationale for Evaluation and Treatment: Rehabilitation   ONSET DATE: 01/08/2024  SUBJECTIVE:   SUBJECTIVE STATEMENT: Eval statement 01/20/2024: Pt stated that he's about 2 weeks out from surgery and has been feeling pretty good, no real pain, just some difficulties with things like putting pants on or getting into the shower.  PERTINENT HISTORY: Surgery was 01/08/2024 PAIN:  Are you having pain? No  PRECAUTIONS: Other: check media referral, page 3 for surgical protocol.  Currently in phase 1  Keep brace locked in extension outside of therapy, avoid active knee extension, active knee flexion as tolerated  RED FLAGS: None   WEIGHT BEARING RESTRICTIONS: Yes WBAT with brace locked in extenstion  FALLS:  Has patient fallen in last 6 months? No  LIVING ENVIRONMENT: Lives with: lives with their family Lives in: House/apartment Has following equipment at home: None  PLOF: Independent  PATIENT GOALS: "just get better"  NEXT MD VISIT: 3 week follow up from surgery  OBJECTIVE:  Note: Objective measures were completed at Evaluation unless otherwise noted.  PATIENT SURVEYS:  LEFS 24/80  COGNITION: Overall cognitive status: Within functional limits for tasks assessed     SENSATION: WFL  EDEMA:  Moderate edema In L knee  MUSCLE LENGTH: Not tested d/t surgical precautions  POSTURE: No Significant postural limitations  PALPATION: Tenderness around surgical site  LOWER EXTREMITY ROM:  Passive ROM Right eval Left eval  Hip flexion [facility]  Hip extension [facility]  Hip abduction    Hip adduction    Hip internal rotation    Hip external rotation    Knee flexion WFL 30d  Knee extension 0 0  Ankle dorsiflexion    Ankle plantarflexion    Ankle inversion    Ankle eversion     (Blank rows = not tested)  ! Indicates pain with testing  LOWER EXTREMITY MMT: NI d/t  surgical precautions  MMT Right eval Left eval  Hip flexion    Hip extension    Hip abduction    Hip adduction    Hip internal rotation    Hip external rotation    Knee flexion    Knee extension    Ankle dorsiflexion    Ankle plantarflexion    Ankle inversion    Ankle eversion     (Blank rows = not tested)  ! Indicates pain with testing  LOWER EXTREMITY SPECIAL TESTS:  Not indicated d/t surgery  FUNCTIONAL TESTS:  Not indicated  GAIT: Distance walked: 125ft Assistive device utilized:  Brace locked in extension on LLE Level of assistance: Complete Independence                                                                                                                               [facility] Adult PT Treatment:                                                DATE: 01/20/2024  Self Care: POC discussion Answered all questions Pt education detailed below    PATIENT EDUCATION:  Education details: Pt received education regarding HEP performance, ADL performance, functional activity tolerance, impairment education, appropriate performance of therapeutic activities. Surgery methodology, surgical precautions, surgical protocol. Person educated: Patient Education method: Explanation, Demonstration, Tactile cues, Verbal cues, and Handouts Education comprehension: verbalized understanding and returned demonstration  HOME EXERCISE PROGRAM: Access Code: [email or web address] URL: [email or web address] Date: 01/20/2024 Prepared by: Sheliah Plane  Exercises - Supine Active Straight Leg Raise  - 1 x daily - 7 x weekly - 2-3 sets - 12-20 reps - 3s hold - Long Sitting Quad Set  - 1 x daily - 7 x weekly - 2-3 sets - 12-20 reps - 8s hold - Seated Active Assistive Knee Flexion and Extension  - 1 x daily - 7 x weekly - 3 sets - 20 reps - 5s hold  ASSESSMENT:  CLINICAL IMPRESSION: Eval impression (01/20/2024): Pt. attended today's physical therapy session for evaluation of Left knee  arthroscopy with MPFL reconstruction and trochleoplasty on 01/08/2024. Pt currently does not have pain however mentioned having most difficulties with dressing, ambulation, and showering. Pt has notable deficits with knee rom, quadriceps activation, and functional ability with ADLs. Pt would  benefit from therapeutic focus on quadriceps activation and ROM work, progressing within surgical protocol, laid out well on page 3 of referral found in the media tab.  Treatment performed today focused on patient education detailed in obj. Pt demonstrated great understanding of education provided. required minimal cues and no assistance for appropriate performance with today's activities. Pt requires the intervention of skilled outpatient physical therapy to address the aforementioned deficits and progress towards a functional level in line with therapeutic goals.   OBJECTIVE IMPAIRMENTS: Abnormal gait, decreased balance, decreased endurance, decreased knowledge of condition, decreased mobility, difficulty walking, decreased ROM, decreased strength, and increased edema.   ACTIVITY LIMITATIONS: carrying, lifting, bending, squatting, sleeping, stairs, bathing, dressing, and locomotion level  PARTICIPATION LIMITATIONS: interpersonal relationship, shopping, community activity, occupation, and school  REHAB POTENTIAL: Excellent  CLINICAL DECISION MAKING: Stable/uncomplicated  EVALUATION COMPLEXITY: Low   GOALS: Goals reviewed with patient? Yes  SHORT TERM GOALS: Target date: 03/02/2024 Pt will be independent with administered HEP to demonstrate the competency necessary for long term managemnet of symptoms at home.  Baseline: Goal status: INITIAL  2.  Pt will be able to perform a straight leg raise with </= 3d of quadriceps lag to demonstrate improved quadriceps motor recruitment during activity. Baseline: unable Goal status: INITIAL  3.  Pt will demonstrate active knee flexion to 90d to demonstrate  improvement in ROM necessary for daily function and staying on track with surgical protocol. Baseline: 30d Goal status: INITIAL   LONG TERM GOALS: Target date: 04/13/2024   Pt. Will achieve a LEFS score of at least 60/80 as to demonstrate improvement in self-perceived functional ability with daily activities.  Baseline: 24/80 Goal status: INITIAL  2.  Pt will demonstrate 0-120d of Active knee ROM to demonstrate improved quality of movement and mobility neccessary for all ADLs and community participation in recreational activities. Baseline:  Goal status: INITIAL  3.  Pt will demonstrate global hip/knee strength at least 4+/5 to demonstrate improved motor recruitment, strength necessary for quality movement, and performance of functional activities such as prolonged walking and running. Baseline: Not indicated at baseline, assumed <3 d/t surgical precautions Goal status: INITIAL    PLAN:  PT FREQUENCY: 2x/week  PT DURATION: 12 weeks  PLANNED INTERVENTIONS: 97110-Therapeutic exercises, 97530- Therapeutic activity, [id number]- Neuromuscular re-education, 97535- Self Care, 13086- Manual therapy, [id number]- Gait training, [phone]- Electrical stimulation (manual), [phone]- Wound care (first 20 sq cm), Patient/Family education, Balance training, Stair training, Joint mobilization, Scar mobilization, Compression bandaging, and DME instructions  PLAN FOR NEXT SESSION: Review HEP, begin POC as detailed in assessment and within surgical protocol.   Sheliah Plane, PT, DPT 01/20/2024, 4:43 PM  For all possible CPT codes, reference the Planned Interventions line above.     Check all conditions that are expected to impact treatment: {Conditions expected to impact treatment:None of these apply   If treatment provided at initial evaluation, no treatment charged due to lack of authorization.

## 2024-01-27 ENCOUNTER — Ambulatory Visit

## 2024-01-27 DIAGNOSIS — M25662 Stiffness of left knee, not elsewhere classified: Secondary | ICD-10-CM

## 2024-01-27 DIAGNOSIS — R2689 Other abnormalities of gait and mobility: Secondary | ICD-10-CM

## 2024-01-27 DIAGNOSIS — M6281 Muscle weakness (generalized): Secondary | ICD-10-CM

## 2024-01-27 NOTE — Therapy (Addendum)
 OUTPATIENT PHYSICAL THERAPY TREATMENT NOTE   Patient Name: Patrick Salemi MRN: [id number] DOB:[birth year], 23 y.o., male Today's Date: 01/27/2024  END OF SESSION:  PT End of Session - 01/27/24 1356     Visit Number 2    Number of Visits 24    Date for PT Re-Evaluation 04/13/24    Authorization Type MCD    Authorization Time Period 8 visits approved 01/20/24-03/20/24    PT Start Time 1400    PT Stop Time 1440    PT Time Calculation (min) 40 min    Activity Tolerance Patient tolerated treatment well    Behavior During Therapy [facility] for tasks assessed/performed             Past Medical History:  Diagnosis Date   History of kidney stones    Past Surgical History:  Procedure Laterality Date   KNEE ARTHROSCOPY Right 2017   KNEE RECONSTRUCTION Left 01/08/2024   Procedure: KNEE RECONSTRUCTION;  Surgeon: Bjorn Pippin, MD;  Location: [facility];  Service: Orthopedics;  Laterality: Left;   OSTEOCHONDRAL DEFECT REPAIR/RECONSTRUCTION Left 01/08/2024   Procedure: OSTEOCHONDRAL DEFECT REPAIR/RECONSTRUCTION;  Surgeon: Bjorn Pippin, MD;  Location: [facility];  Service: Orthopedics;  Laterality: Left;   SYNOVECTOMY Left 01/08/2024   Procedure: SYNOVECTOMY;  Surgeon: Bjorn Pippin, MD;  Location: [facility];  Service: Orthopedics;  Laterality: Left;   TIBIA OSTEOTOMY Left 01/08/2024   Procedure: FEMUR OSTEOTOMY;  Surgeon: Bjorn Pippin, MD;  Location: [facility];  Service: Orthopedics;  Laterality: Left;   TONSILLECTOMY     UPPER GI ENDOSCOPY N/A 10/16/2021   Procedure: UPPER GI ENDOSCOPY;  Surgeon: Luretha Murphy, MD;  Location: [facility];  Service: General;  Laterality: N/A;   Patient Active Problem List   Diagnosis Date Noted   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 He has been compliant with his HEP.  Eval statement: Pt stated that he's about 2 weeks out from surgery and has been feeling pretty good, no real pain, just some difficulties with things like putting pants on or getting into the shower.  PERTINENT HISTORY: Surgery was 01/08/2024 PAIN:  Are you having pain? No  PRECAUTIONS: Other: check media referral, page 3 for surgical protocol.  Currently in phase 1  Keep brace locked in extension outside of therapy, avoid active knee extension, active knee flexion as tolerated  RED FLAGS: None   WEIGHT BEARING RESTRICTIONS: Yes WBAT with brace locked in extenstion  FALLS:  Has patient fallen in last 6 months? No  LIVING ENVIRONMENT: Lives with: lives with their family Lives in: House/apartment Has following equipment at home: None  PLOF: Independent  PATIENT GOALS: "just get better"  NEXT MD VISIT: 3 week follow up from surgery  OP Date: 01/08/2024  2 weeks 01/22/2024  4 weeks 02/05/2024  6 weeks 02/19/2024  8 weeks 03/04/2024  10 weeks 03/18/2024  12  weeks 04/01/2024    OBJECTIVE:  Note: Objective measures were completed at Evaluation unless otherwise noted.  PATIENT SURVEYS:  LEFS 24/80  COGNITION: Overall cognitive status: Within functional limits for tasks assessed     SENSATION: WFL  EDEMA:  Moderate edema In L knee  MUSCLE LENGTH: Not tested d/t surgical precautions  POSTURE: No Significant postural limitations  PALPATION: Tenderness around surgical site  LOWER EXTREMITY ROM:  Passive ROM  Right eval Left eval Left 01/27/24  Hip flexion [facility]   Hip extension [facility]   Hip abduction     Hip adduction     Hip internal rotation     Hip external rotation     Knee flexion WFL 30d 56  Knee extension 0 0   Ankle dorsiflexion     Ankle plantarflexion     Ankle inversion     Ankle eversion      (Blank rows = not tested)  ! Indicates pain with testing  LOWER EXTREMITY MMT: NI d/t surgical precautions  MMT Right eval Left eval  Hip flexion    Hip extension    Hip abduction    Hip adduction    Hip internal rotation    Hip external rotation    Knee flexion    Knee extension    Ankle dorsiflexion    Ankle plantarflexion    Ankle inversion    Ankle eversion     (Blank rows = not tested)  ! Indicates pain with testing  LOWER EXTREMITY SPECIAL TESTS:  Not indicated d/t surgery  FUNCTIONAL TESTS:  Not indicated  GAIT: Distance walked: 120ft Assistive device utilized:  Brace locked in extension on LLE Level of assistance: Complete Independence  [facility] Adult PT Treatment:                                                DATE: 01/27/24 Therapeutic Exercise: Seated AAROM flex/ext using RLE to assist 2x10 Quad sets Lt with heel prop 5" hold 2x10 SLR Lt (with brace) x10 Supine hamstring set 5" hold 2x10 Gentle heel slides with strap 2x10 Self Care: Post op protocol, keeping brace locked in ext outside of PT, using ice PRN after activity, importance of completing HEP                                                                                                                              [facility] Adult PT Treatment:                                                DATE  Self Care: POC discussion Answered all questions Pt education detailed below    PATIENT EDUCATION:  Education details: Pt received education regarding HEP performance, ADL performance, functional activity tolerance, impairment education, appropriate performance of therapeutic activities. Surgery  methodology, surgical precautions, surgical protocol. Person educated: Patient Education method: Explanation, Demonstration, Tactile cues, Verbal cues, and Handouts Education comprehension: verbalized understanding and returned demonstration  HOME EXERCISE PROGRAM: Access Code: [email or web address] URL: [email or web address] Date: 01/20/2024 Prepared by: Sheliah Plane  Exercises - Supine Active Straight Leg Raise  - 1 x daily - 7 x weekly - 2-3 sets - 12-20 reps - 3s hold - Long Sitting Quad Set  - 1 x daily - 7 x weekly - 2-3 sets - 12-20 reps - 8s hold - Seated Active Assistive Knee Flexion and Extension  - 1 x daily - 7 x weekly - 3 sets - 20 reps - 5s hold  ASSESSMENT:  CLINICAL IMPRESSION: Patient presents to first follow up PT session reporting no current pain and minimal pain recently and that he has been compliant with his HEP and that he has noticed the SLR have gotten easier. Session today focused on increasing quad contraction quality and gentle ROM. He shows improvement with quad control with less lag noted during SLR and flexion ROM increased to 56 today. Patient was able to tolerate all prescribed exercises with no adverse effects. Patient continues to benefit from skilled PT services and should be progressed as able to improve functional independence.   Eval impression (01/20/2024): Pt. attended today's physical therapy session for evaluation of Left knee arthroscopy with MPFL reconstruction and trochleoplasty on 01/08/2024. Pt currently does not have pain however mentioned having most difficulties with dressing, ambulation, and showering. Pt has notable deficits with knee rom, quadriceps activation, and functional ability with ADLs. Pt would benefit from therapeutic focus on quadriceps activation and ROM work, progressing within surgical protocol, laid out well on page 3 of referral found in the media tab.  Treatment performed today focused on patient education detailed in obj.  Pt demonstrated great understanding of education provided. required minimal cues and no assistance for appropriate performance with today's activities. Pt requires the intervention of skilled outpatient physical therapy to address the aforementioned deficits and progress towards a functional level in line with therapeutic goals.   OBJECTIVE IMPAIRMENTS: Abnormal gait, decreased balance, decreased endurance, decreased knowledge of condition, decreased mobility, difficulty walking, decreased ROM, decreased strength, and increased edema.   ACTIVITY LIMITATIONS: carrying, lifting, bending, squatting, sleeping, stairs, bathing, dressing, and locomotion level  PARTICIPATION LIMITATIONS: interpersonal relationship, shopping, community activity, occupation, and school  REHAB POTENTIAL: Excellent  CLINICAL DECISION MAKING: Stable/uncomplicated  EVALUATION COMPLEXITY: Low   GOALS: Goals reviewed with patient? Yes  SHORT TERM GOALS: Target date: 03/02/2024 Pt will be independent with administered HEP to demonstrate the competency necessary for long term managemnet of symptoms at home.  Baseline: Goal status: INITIAL  2.  Pt will be able to perform a straight leg raise with </= 3d of quadriceps lag to demonstrate improved quadriceps motor recruitment during activity. Baseline: unable Goal status: INITIAL  3.  Pt will demonstrate active knee flexion to 90d to demonstrate improvement in ROM necessary for daily function and staying on track with surgical protocol. Baseline: 30d Goal status: INITIAL   LONG TERM GOALS: Target date: 04/13/2024   Pt. Will achieve a LEFS score of at least 60/80 as to demonstrate improvement in self-perceived functional ability with daily activities.  Baseline: 24/80 Goal status: INITIAL  2.  Pt will demonstrate 0-120d of Active knee ROM to demonstrate improved quality of movement and mobility  neccessary for all ADLs and community participation in recreational  activities. Baseline:  Goal status: INITIAL  3.  Pt will demonstrate global hip/knee strength at least 4+/5 to demonstrate improved motor recruitment, strength necessary for quality movement, and performance of functional activities such as prolonged walking and running. Baseline: Not indicated at baseline, assumed <3 d/t surgical precautions Goal status: INITIAL   PLAN:  PT FREQUENCY: 2x/week  PT DURATION: 12 weeks  PLANNED INTERVENTIONS: 97110-Therapeutic exercises, 97530- Therapeutic activity, [id number]- Neuromuscular re-education, 97535- Self Care, 62130- Manual therapy, [id number]- Gait training, [phone]- Electrical stimulation (manual), [phone]- Wound care (first 20 sq cm), Patient/Family education, Balance training, Stair training, Joint mobilization, Scar mobilization, Compression bandaging, and DME instructions  PLAN FOR NEXT SESSION: Review HEP, begin POC as detailed in assessment and within surgical protocol.   Berta Minor PTA  01/27/2024, 2:41 PM

## 2024-01-29 ENCOUNTER — Ambulatory Visit: Admitting: Physical Therapy

## 2024-02-03 ENCOUNTER — Encounter: Payer: Self-pay | Admitting: Physical Therapy

## 2024-02-03 ENCOUNTER — Ambulatory Visit: Admitting: Physical Therapy

## 2024-02-03 DIAGNOSIS — M6281 Muscle weakness (generalized): Secondary | ICD-10-CM

## 2024-02-03 DIAGNOSIS — R2689 Other abnormalities of gait and mobility: Secondary | ICD-10-CM

## 2024-02-03 DIAGNOSIS — M25662 Stiffness of left knee, not elsewhere classified: Secondary | ICD-10-CM

## 2024-02-03 NOTE — Therapy (Signed)
 OUTPATIENT PHYSICAL THERAPY TREATMENT NOTE   Patient Name: Daniel Gregory MRN: [id number] DOB:08-Oct-2001, 23 y.o., male Today's Date: 02/03/2024  END OF SESSION:  PT End of Session - 02/03/24 1410     Visit Number 3    Number of Visits 24    Date for PT Re-Evaluation 04/13/24    Authorization Time Period 8 visits approved 01/20/24-03/20/24    Authorization - Visit Number 2    Authorization - Number of Visits 8    PT Start Time 1409    PT Stop Time 1440    PT Time Calculation (min) 31 min              Past Medical History:  Diagnosis Date   History of kidney stones    Past Surgical History:  Procedure Laterality Date   KNEE ARTHROSCOPY Right 2017   KNEE RECONSTRUCTION Left 01/08/2024   Procedure: KNEE RECONSTRUCTION;  Surgeon: Bjorn Pippin, MD;  Location: [facility];  Service: Orthopedics;  Laterality: Left;   OSTEOCHONDRAL DEFECT REPAIR/RECONSTRUCTION Left 01/08/2024   Procedure: OSTEOCHONDRAL DEFECT REPAIR/RECONSTRUCTION;  Surgeon: Bjorn Pippin, MD;  Location: [facility];  Service: Orthopedics;  Laterality: Left;   SYNOVECTOMY Left 01/08/2024   Procedure: SYNOVECTOMY;  Surgeon: Bjorn Pippin, MD;  Location: [facility];  Service: Orthopedics;  Laterality: Left;   TIBIA OSTEOTOMY Left 01/08/2024   Procedure: FEMUR OSTEOTOMY;  Surgeon: Bjorn Pippin, MD;  Location: [facility];  Service: Orthopedics;  Laterality: Left;   TONSILLECTOMY     UPPER GI ENDOSCOPY N/A 10/16/2021   Procedure: UPPER GI ENDOSCOPY;  Surgeon: Luretha Murphy, MD;  Location: [facility];  Service: General;  Laterality: N/A;   Patient Active Problem List   Diagnosis Date Noted   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 Gave verbal understanding of education surrounding 4-12wk transition phase.  Eval statement: Pt stated that he's about 2 weeks out from surgery and has been feeling pretty good, no real pain, just some difficulties with things like putting pants on or getting into the shower.  PERTINENT HISTORY: Surgery was 01/08/2024 PAIN:  Are you having pain? No  PRECAUTIONS: Other: check media referral, page 3 for surgical protocol.  Currently in phase 1  Keep brace locked in extension outside of therapy, avoid active knee extension, active knee flexion as tolerated  RED FLAGS: None   WEIGHT BEARING RESTRICTIONS: Yes WBAT with brace locked in extenstion  Full WB w/o brace as of 02/05/2024  FALLS:  Has patient fallen in last 6 months? No  LIVING ENVIRONMENT: Lives with: lives with their family Lives in: House/apartment Has following equipment at home: None  PLOF: Independent  PATIENT GOALS: "just get better"  NEXT MD VISIT: 3 week follow up from surgery  OP Date: 01/08/2024  2 weeks 01/22/2024  4 weeks 02/05/2024  6 weeks 02/19/2024  8 weeks 03/04/2024  10 weeks 03/18/2024  12 weeks 04/01/2024    OBJECTIVE:  Note: Objective  measures were completed at Evaluation unless otherwise noted.  PATIENT SURVEYS:  LEFS 24/80  COGNITION: Overall cognitive status: Within functional limits for tasks assessed     SENSATION: WFL  EDEMA:  Moderate edema In L knee  MUSCLE LENGTH: Not tested d/t surgical precautions  POSTURE: No Significant postural limitations  PALPATION: Tenderness around surgical site  LOWER EXTREMITY ROM:  Passive ROM Right eval Left eval Left 01/27/24  Hip flexion [facility]    Hip extension [facility]   Hip abduction     Hip adduction     Hip internal rotation     Hip external rotation     Knee flexion WFL 30d 56  Knee extension 0 0   Ankle dorsiflexion     Ankle plantarflexion     Ankle inversion     Ankle eversion      (Blank rows = not tested)  ! Indicates pain with testing  LOWER EXTREMITY MMT: NI d/t surgical precautions  MMT Right eval Left eval  Hip flexion    Hip extension    Hip abduction    Hip adduction    Hip internal rotation    Hip external rotation    Knee flexion    Knee extension    Ankle dorsiflexion    Ankle plantarflexion    Ankle inversion    Ankle eversion     (Blank rows = not tested)  ! Indicates pain with testing  LOWER EXTREMITY SPECIAL TESTS:  Not indicated d/t surgery  FUNCTIONAL TESTS:  Not indicated  GAIT: Distance walked: 129ft Assistive device utilized:  Brace locked in extension on LLE Level of assistance: Complete Independence  [facility] Adult PT Treatment:                                                DATE: 02/03/2024  Therapeutic Exercise: PROM of L knee into flex/ext, gentle overpressure AROM  L knee flex/ext  2x12 (0-120) Therapeutic Activity: UE supported squat isometric  2x6, 10s hold, @90d  knee flexion Seated marches, RTB 2x10, 3s hold POC discussion, 4 week transition phase   [facility] Adult PT Treatment:                                                DATE: 01/27/24 Therapeutic Exercise: Seated AAROM flex/ext using RLE to assist 2x10 Quad sets Lt with heel prop 5" hold 2x10 SLR Lt (with brace) x10 Supine hamstring set 5" hold 2x10 Gentle heel slides with strap 2x10 Self Care: Post op protocol, keeping brace locked in ext outside of PT, using ice PRN after activity, importance of completing HEP  [facility] Adult PT Treatment:                                                 DATE  Self Care: POC discussion Answered all questions Pt education detailed below    PATIENT EDUCATION:  Education details: Pt received education regarding HEP performance, ADL performance, functional activity tolerance, impairment education, appropriate performance of therapeutic activities. Surgery methodology, surgical precautions, surgical protocol. Person educated: Patient Education method: Explanation, Demonstration, Tactile cues, Verbal cues, and Handouts Education comprehension: verbalized understanding and returned demonstration  HOME EXERCISE PROGRAM: Access Code: [email or web address] URL: [email or web address] Date: 01/20/2024 Prepared by: Sheliah Plane  Exercises - Supine Active Straight Leg Raise  - 1 x daily - 7 x weekly - 2-3 sets - 12-20 reps - 3s hold - Long Sitting Quad Set  - 1 x daily - 7 x weekly - 2-3 sets - 12-20 reps - 8s hold - Seated Active Assistive Knee Flexion and Extension  - 1 x daily - 7 x weekly - 3 sets - 20 reps - 5s hold  ASSESSMENT:  CLINICAL IMPRESSION: Pt attended physical therapy session 10 minutes late for continuation of treatment regarding  Left knee arthroscopy with MPFL reconstruction and trochleoplasty on 01/08/2024. Today's treatment focused on improvement of quadriceps recruitment, L knee A/PROM, and understanding surrounding 4-12 week transition phase protocol. Pt showed  great tolerance to treatment and demonstrated improvement with Knee AROM (0-120), quadriceps motor control, pt was able to perform x5 straight leg raise with no extensor lag. Pt required minimal verbal/tactile cuing alongside no physical assistance for safe and appropriate performance of today's activities. Continue with therapeutic focus on quadriceps recruitment in stance, global hip/knee strengthening within 60-120 degrees of knee ROM in closed chain, progress by 20 degrees each week as indicated by surgical protocol.  Eval impression (01/20/2024): Pt. attended  today's physical therapy session for evaluation of Left knee arthroscopy with MPFL reconstruction and trochleoplasty on 01/08/2024. Pt currently does not have pain however mentioned having most difficulties with dressing, ambulation, and showering. Pt has notable deficits with knee rom, quadriceps activation, and functional ability with ADLs. Pt would benefit from therapeutic focus on quadriceps activation and ROM work, progressing within surgical protocol, laid out well on page 3 of referral found in the media tab.  Treatment performed today focused on patient education detailed in obj. Pt demonstrated great understanding of education provided. required minimal cues and no assistance for appropriate performance with today's activities. Pt requires the intervention of skilled outpatient physical therapy to address the aforementioned deficits and progress towards a functional level in line with therapeutic goals.   OBJECTIVE IMPAIRMENTS: Abnormal gait, decreased balance, decreased endurance, decreased knowledge of condition, decreased mobility, difficulty walking, decreased ROM, decreased strength, and increased edema.   ACTIVITY LIMITATIONS: carrying, lifting, bending, squatting, sleeping, stairs, bathing, dressing, and locomotion level  PARTICIPATION LIMITATIONS: interpersonal relationship, shopping, community activity, occupation, and school  REHAB POTENTIAL: Excellent  CLINICAL DECISION MAKING: Stable/uncomplicated  EVALUATION COMPLEXITY: Low   GOALS: Goals reviewed with patient? Yes  SHORT TERM GOALS: Target date: 03/02/2024 Pt will be independent with administered HEP to demonstrate the competency necessary for long term managemnet of symptoms at home.  Baseline: Goal status: INITIAL  2.  Pt will be able to perform a straight leg raise with </= 3d of quadriceps lag to demonstrate improved quadriceps motor  recruitment during activity. Baseline: unable Goal status: INITIAL  3.  Pt will  demonstrate active knee flexion to 90d to demonstrate improvement in ROM necessary for daily function and staying on track with surgical protocol. Baseline: 30d Goal status: INITIAL   LONG TERM GOALS: Target date: 04/13/2024   Pt. Will achieve a LEFS score of at least 60/80 as to demonstrate improvement in self-perceived functional ability with daily activities.  Baseline: 24/80 Goal status: INITIAL  2.  Pt will demonstrate 0-120d of Active knee ROM to demonstrate improved quality of movement and mobility neccessary for all ADLs and community participation in recreational activities. Baseline:  Goal status: INITIAL  3.  Pt will demonstrate global hip/knee strength at least 4+/5 to demonstrate improved motor recruitment, strength necessary for quality movement, and performance of functional activities such as prolonged walking and running. Baseline: Not indicated at baseline, assumed <3 d/t surgical precautions Goal status: INITIAL   PLAN:  PT FREQUENCY: 2x/week  PT DURATION: 12 weeks  PLANNED INTERVENTIONS: 97110-Therapeutic exercises, 97530- Therapeutic activity, [id number]- Neuromuscular re-education, 97535- Self Care, [id number]- Manual therapy, [id number]- Gait training, [phone]- Electrical stimulation (manual), [phone]- Wound care (first 20 sq cm), Patient/Family education, Balance training, Stair training, Joint mobilization, Scar mobilization, Compression bandaging, and DME instructions  PLAN FOR NEXT SESSION: Continue with therapeutic focus on quadriceps recruitment in stance, global hip/knee strengthening within 60-120 degrees of knee ROM in closed chain, progress by 20 degrees each week as indicated by surgical protocol.   Sheliah Plane, PT, DPT 02/03/2024, 3:25 PM

## 2024-02-05 ENCOUNTER — Ambulatory Visit: Admitting: Physical Therapy

## 2024-02-05 ENCOUNTER — Encounter: Payer: Self-pay | Admitting: Physical Therapy

## 2024-02-05 DIAGNOSIS — M25662 Stiffness of left knee, not elsewhere classified: Secondary | ICD-10-CM | POA: Diagnosis not present

## 2024-02-05 DIAGNOSIS — R2689 Other abnormalities of gait and mobility: Secondary | ICD-10-CM

## 2024-02-05 DIAGNOSIS — M6281 Muscle weakness (generalized): Secondary | ICD-10-CM

## 2024-02-05 NOTE — Therapy (Signed)
 OUTPATIENT PHYSICAL THERAPY TREATMENT NOTE   Patient Name: Daniel Gregory MRN: [id number] DOB:11-03-2001, 23 y.o., male Today's Date: 02/05/2024  END OF SESSION:  PT End of Session - 02/05/24 1403     Visit Number 4    Number of Visits 24    Date for PT Re-Evaluation 04/13/24    Authorization Type MCD    Authorization Time Period 8 visits approved 01/20/24-03/20/24    Authorization - Visit Number 3    Authorization - Number of Visits 8    PT Start Time 1402    PT Stop Time 1440    PT Time Calculation (min) 38 min    Activity Tolerance Patient tolerated treatment well    Behavior During Therapy WFL for tasks assessed/performed               Past Medical History:  Diagnosis Date   History of kidney stones    Past Surgical History:  Procedure Laterality Date   KNEE ARTHROSCOPY Right 2017   KNEE RECONSTRUCTION Left 01/08/2024   Procedure: KNEE RECONSTRUCTION;  Surgeon: Bjorn Pippin, MD;  Location: [facility];  Service: Orthopedics;  Laterality: Left;   OSTEOCHONDRAL DEFECT REPAIR/RECONSTRUCTION Left 01/08/2024   Procedure: OSTEOCHONDRAL DEFECT REPAIR/RECONSTRUCTION;  Surgeon: Bjorn Pippin, MD;  Location: [facility];  Service: Orthopedics;  Laterality: Left;   SYNOVECTOMY Left 01/08/2024   Procedure: SYNOVECTOMY;  Surgeon: Bjorn Pippin, MD;  Location: [facility];  Service: Orthopedics;  Laterality: Left;   TIBIA OSTEOTOMY Left 01/08/2024   Procedure: FEMUR OSTEOTOMY;  Surgeon: Bjorn Pippin, MD;  Location: [facility];  Service: Orthopedics;  Laterality: Left;   TONSILLECTOMY     UPPER GI ENDOSCOPY N/A 10/16/2021   Procedure: UPPER GI ENDOSCOPY;  Surgeon: Luretha Murphy, MD;  Location: [facility];  Service: General;  Laterality: N/A;   Patient Active Problem List   Diagnosis Date Noted   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 Gave verbal understanding of education surrounding 4-12wk transition phase.  Eval statement: Pt stated that he's about 2 weeks out from surgery and has been feeling pretty good, no real pain, just some difficulties with things like putting pants on or getting into the shower.  PERTINENT HISTORY: Surgery was 01/08/2024 PAIN:  Are you having pain? No  PRECAUTIONS: Other: check media referral, page 3 for surgical protocol.  Currently in phase 1  Keep brace locked in extension outside of therapy, avoid active knee extension, active knee flexion as tolerated  RED FLAGS: None   WEIGHT BEARING RESTRICTIONS: Yes WBAT with brace locked in extenstion  Full WB w/o brace as of 02/05/2024  FALLS:  Has patient fallen in last 6 months? No  LIVING ENVIRONMENT: Lives with: lives with their family Lives in: House/apartment Has following equipment at home: None  PLOF: Independent  PATIENT GOALS: "just get better"  NEXT MD VISIT: 3 week follow up from surgery  OP Date: 01/08/2024  2 weeks 01/22/2024  4 weeks 02/05/2024  6 weeks 02/19/2024  8 weeks 03/04/2024  10 weeks 03/18/2024  12 weeks 04/01/2024    OBJECTIVE:  Note: Objective measures were completed at Evaluation unless otherwise noted.  PATIENT SURVEYS:  LEFS 24/80  COGNITION: Overall cognitive status: Within functional limits for tasks assessed     SENSATION: WFL  EDEMA:  Moderate edema In L knee  MUSCLE LENGTH: Not tested d/t surgical precautions  POSTURE: No Significant postural  limitations  PALPATION: Tenderness around surgical site  LOWER EXTREMITY ROM:  Passive ROM Right eval Left eval Left 01/27/24  Hip flexion [facility]   Hip extension [facility]   Hip abduction     Hip adduction     Hip internal rotation     Hip external rotation     Knee flexion WFL 30d 56  Knee extension 0 0   Ankle dorsiflexion     Ankle plantarflexion     Ankle inversion     Ankle eversion      (Blank rows = not tested)  ! Indicates pain with testing  LOWER EXTREMITY MMT: NI d/t surgical precautions  MMT Right eval Left eval  Hip flexion    Hip extension    Hip abduction    Hip adduction    Hip internal rotation    Hip external rotation    Knee flexion    Knee extension    Ankle dorsiflexion    Ankle plantarflexion    Ankle inversion    Ankle eversion     (Blank rows = not tested)  ! Indicates pain with testing  LOWER EXTREMITY SPECIAL TESTS:  Not indicated d/t surgery  FUNCTIONAL TESTS:  Not indicated  GAIT: Distance walked: 130ft Assistive device utilized:  Brace locked in extension on LLE Level of assistance: Complete Independence  [facility] Adult PT Treatment:                                                DATE: 02/05/2024  Therapeutic Exercise: PROM of L knee into flex/ext, gentle overpressure AROM  L knee flex/ext  2x12 , 3s hold (0-120) SLR 3x12.2s hold  Therapeutic Activity: UE assist lunges 2x12, 3s 60-120 ROM only UE supported squat isometric  2x6, 10s hold, @90d  knee flexion    [facility] Adult PT Treatment:                                                DATE: 02/03/2024  Therapeutic Exercise: PROM of L knee into flex/ext, gentle overpressure AROM  L knee flex/ext  2x12 (0-120) Therapeutic Activity: UE supported squat isometric  2x6, 10s hold, @90d  knee flexion Seated marches, RTB 2x10, 3s hold POC discussion, 4 week transition phase   PATIENT EDUCATION:  Education details: Pt received education regarding HEP performance, ADL  performance, functional activity tolerance, impairment education, appropriate performance of therapeutic activities. Surgery methodology, surgical precautions, surgical protocol. Person educated: Patient Education method: Explanation, Demonstration, Tactile cues, Verbal cues, and Handouts Education comprehension: verbalized understanding and returned demonstration  HOME EXERCISE PROGRAM: Access Code: [email or web address] URL: [email or web address] Date: 01/20/2024 Prepared by: Sheliah Plane  Exercises - Supine Active Straight Leg Raise  - 1 x daily - 7 x weekly - 2-3 sets -  12-20 reps - 3s hold - Long Sitting Quad Set  - 1 x daily - 7 x weekly - 2-3 sets - 12-20 reps - 8s hold - Seated Active Assistive Knee Flexion and Extension  - 1 x daily - 7 x weekly - 3 sets - 20 reps - 5s hold  ASSESSMENT:  CLINICAL IMPRESSION: Pt attended physical therapy session for continuation of treatment regarding  Left knee arthroscopy with MPFL reconstruction and trochleoplasty on 01/08/2024. Today's treatment focused on improvement of  quadriceps recruitment, global/hip knee strengthening and stance tolerance within surgical protocol. Pt showed  great tolerance to treatment and demonstrated improvement with quadriceps recruitment during SLR, no extensor lag across 3x12. Marland Kitchen Pt required minimal cuing alongside no physical assistance for safe and appropriate performance of today's activities. Continue with therapeutic focus on  Continue with therapeutic focus on quadriceps recruitment in stance, global hip/knee strengthening within 60-120 degrees of knee ROM in closed chain, progress by 20 degrees each week as indicated by surgical protocol..   Eval impression (01/20/2024): Pt. attended today's physical therapy session for evaluation of Left knee arthroscopy with MPFL reconstruction and trochleoplasty on 01/08/2024. Pt currently does not have pain however mentioned having most difficulties with dressing, ambulation,  and showering. Pt has notable deficits with knee rom, quadriceps activation, and functional ability with ADLs. Pt would benefit from therapeutic focus on quadriceps activation and ROM work, progressing within surgical protocol, laid out well on page 3 of referral found in the media tab.  Treatment performed today focused on patient education detailed in obj. Pt demonstrated great understanding of education provided. required minimal cues and no assistance for appropriate performance with today's activities. Pt requires the intervention of skilled outpatient physical therapy to address the aforementioned deficits and progress towards a functional level in line with therapeutic goals.   OBJECTIVE IMPAIRMENTS: Abnormal gait, decreased balance, decreased endurance, decreased knowledge of condition, decreased mobility, difficulty walking, decreased ROM, decreased strength, and increased edema.   ACTIVITY LIMITATIONS: carrying, lifting, bending, squatting, sleeping, stairs, bathing, dressing, and locomotion level  PARTICIPATION LIMITATIONS: interpersonal relationship, shopping, community activity, occupation, and school  REHAB POTENTIAL: Excellent  CLINICAL DECISION MAKING: Stable/uncomplicated  EVALUATION COMPLEXITY: Low   GOALS: Goals reviewed with patient? Yes  SHORT TERM GOALS: Target date: 03/02/2024 Pt will be independent with administered HEP to demonstrate the competency necessary for long term managemnet of symptoms at home.  Baseline: Goal status: INITIAL  2.  Pt will be able to perform a straight leg raise with </= 3d of quadriceps lag to demonstrate improved quadriceps motor recruitment during activity. Baseline: unable Goal status: INITIAL  3.  Pt will demonstrate active knee flexion to 90d to demonstrate improvement in ROM necessary for daily function and staying on track with surgical protocol. Baseline: 30d Goal status: INITIAL   LONG TERM GOALS: Target date:  04/13/2024   Pt. Will achieve a LEFS score of at least 60/80 as to demonstrate improvement in self-perceived functional ability with daily activities.  Baseline: 24/80 Goal status: INITIAL  2.  Pt will demonstrate 0-120d of Active knee ROM to demonstrate improved quality of movement and mobility neccessary for all ADLs and community participation in recreational activities. Baseline:  Goal status: INITIAL  3.  Pt will demonstrate global hip/knee strength at least 4+/5 to demonstrate improved motor recruitment, strength necessary for quality movement, and performance of functional activities such as prolonged walking and running. Baseline: Not indicated at baseline, assumed <3 d/t surgical precautions Goal status: INITIAL  PLAN:  PT FREQUENCY: 2x/week  PT DURATION: 12 weeks  PLANNED INTERVENTIONS: 97110-Therapeutic exercises, 97530- Therapeutic activity, [id number]- Neuromuscular re-education, 97535- Self Care, [id number]- Manual therapy, [phone]- Gait training, [phone]- Electrical stimulation (manual), [phone]- Wound care (first 20 sq cm), Patient/Family education, Balance training, Stair training, Joint mobilization, Scar mobilization, Compression bandaging, and DME instructions  PLAN FOR NEXT SESSION: Continue with therapeutic focus on quadriceps recruitment in stance, global hip/knee strengthening within 60-120 degrees of knee ROM in closed chain, progress by 20 degrees each week as indicated by surgical protocol.   Sheliah Plane, PT, DPT 02/05/2024, 3:31 PM

## 2024-02-10 ENCOUNTER — Encounter: Payer: Self-pay | Admitting: Physical Therapy

## 2024-02-10 ENCOUNTER — Ambulatory Visit: Admitting: Physical Therapy

## 2024-02-10 DIAGNOSIS — M25662 Stiffness of left knee, not elsewhere classified: Secondary | ICD-10-CM

## 2024-02-10 DIAGNOSIS — M6281 Muscle weakness (generalized): Secondary | ICD-10-CM

## 2024-02-10 DIAGNOSIS — R2689 Other abnormalities of gait and mobility: Secondary | ICD-10-CM

## 2024-02-10 NOTE — Therapy (Signed)
 OUTPATIENT PHYSICAL THERAPY TREATMENT NOTE   Patient Name: Daniel Gregory MRN: [id number] DOB:2001/05/15, 23 y.o., male Today's Date: 02/10/2024  END OF SESSION:  PT End of Session - 02/10/24 1400     Visit Number 5    Number of Visits 24    Date for PT Re-Evaluation 04/13/24    Authorization Type MCD    Authorization Time Period 8 visits approved 01/20/24-03/20/24    Authorization - Visit Number 4    Authorization - Number of Visits 8    PT Start Time 1400    PT Stop Time 1440    PT Time Calculation (min) 40 min    Activity Tolerance Patient tolerated treatment well    Behavior During Therapy WFL for tasks assessed/performed                Past Medical History:  Diagnosis Date   History of kidney stones    Past Surgical History:  Procedure Laterality Date   KNEE ARTHROSCOPY Right 2017   KNEE RECONSTRUCTION Left 01/08/2024   Procedure: KNEE RECONSTRUCTION;  Surgeon: Bjorn Pippin, MD;  Location: [facility];  Service: Orthopedics;  Laterality: Left;   OSTEOCHONDRAL DEFECT REPAIR/RECONSTRUCTION Left 01/08/2024   Procedure: OSTEOCHONDRAL DEFECT REPAIR/RECONSTRUCTION;  Surgeon: Bjorn Pippin, MD;  Location: [facility];  Service: Orthopedics;  Laterality: Left;   SYNOVECTOMY Left 01/08/2024   Procedure: SYNOVECTOMY;  Surgeon: Bjorn Pippin, MD;  Location: [facility];  Service: Orthopedics;  Laterality: Left;   TIBIA OSTEOTOMY Left 01/08/2024   Procedure: FEMUR OSTEOTOMY;  Surgeon: Bjorn Pippin, MD;  Location: [facility];  Service: Orthopedics;  Laterality: Left;   TONSILLECTOMY     UPPER GI ENDOSCOPY N/A 10/16/2021   Procedure: UPPER GI ENDOSCOPY;  Surgeon: Luretha Murphy, MD;  Location: [facility];  Service: General;  Laterality: N/A;   Patient Active Problem List   Diagnosis Date Noted   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   Abdominal pain     PCP: Gordan Payment, MD  REFERRING PROVIDER: Bjorn Pippin,  MD  REFERRING DIAG: Left knee arthroscopy with MPFL reconstruction and trochleoplasty 2/20   THERAPY DIAG:  Stiffness of left knee, not elsewhere classified  Muscle weakness (generalized)  Other abnormalities of gait and mobility  Rationale for Evaluation and Treatment: Rehabilitation   ONSET DATE: 01/08/2024  Next MD appt: End of march  SUBJECTIVE:   SUBJECTIVE STATEMENT: Pt saw surgeon for follow, was told to ditch the brace and just take it easy for a month, no running   Eval statement: Pt stated that he's about 2 weeks out from surgery and has been feeling pretty good, no real pain, just some difficulties with things like putting pants on or getting into the shower.  PERTINENT HISTORY: Surgery was 01/08/2024 PAIN:  Are you having pain? No  PRECAUTIONS: Other: check media referral, page 3 for surgical protocol.  Currently in phase 1  Keep brace locked in extension outside of therapy, avoid active knee extension, active knee flexion as tolerated  RED FLAGS: None   WEIGHT BEARING RESTRICTIONS: Yes WBAT with brace locked in extenstion  Full WB w/o brace as of 02/05/2024  FALLS:  Has patient fallen in last 6 months? No  LIVING ENVIRONMENT: Lives with: lives with their family Lives in: House/apartment Has following equipment at home: None  PLOF: Independent  PATIENT GOALS: "just get better"  NEXT MD VISIT: 3 week follow up from surgery  OP Date:  01/08/2024  2 weeks 01/22/2024  4 weeks 02/05/2024  6 weeks 02/19/2024  8 weeks 03/04/2024  10 weeks 03/18/2024  12 weeks 04/01/2024    OBJECTIVE:  Note: Objective measures were completed at Evaluation unless otherwise noted.  PATIENT SURVEYS:  LEFS 24/80  COGNITION: Overall cognitive status: Within functional limits for tasks assessed     SENSATION: WFL  EDEMA:  Moderate edema In L knee  MUSCLE LENGTH: Not tested d/t surgical precautions  POSTURE: No Significant postural limitations  PALPATION: Tenderness  around surgical site  LOWER EXTREMITY ROM:  Passive ROM Right eval Left eval Left 01/27/24  Hip flexion [facility]   Hip extension [facility]   Hip abduction     Hip adduction     Hip internal rotation     Hip external rotation     Knee flexion WFL 30d 56  Knee extension 0 0   Ankle dorsiflexion     Ankle plantarflexion     Ankle inversion     Ankle eversion      (Blank rows = not tested)  ! Indicates pain with testing  LOWER EXTREMITY MMT: NI d/t surgical precautions  MMT Right eval Left eval  Hip flexion    Hip extension    Hip abduction    Hip adduction    Hip internal rotation    Hip external rotation    Knee flexion    Knee extension    Ankle dorsiflexion    Ankle plantarflexion    Ankle inversion    Ankle eversion     (Blank rows = not tested)  ! Indicates pain with testing  LOWER EXTREMITY SPECIAL TESTS:  Not indicated d/t surgery  FUNCTIONAL TESTS:  Not indicated  GAIT: Distance walked: 168ft Assistive device utilized:  Brace locked in extension on LLE Level of assistance: Complete Independence   [facility] Adult PT Treatment:                                                DATE: 02/10/2024  Therapeutic Exercise: PROM of L knee into flex/ext, gentle overpressure Reached 0-130 actively following PROM Therapeutic Activity: UE assist elevated lunges 2x12, 3s 60-120 ROM only UE supported squat isometric  2x8, 5s hold, @100d  knee flexion Step down onto sissel bosu 2x6, 5s hold on bosu POC discussion, HEP  [facility] Adult PT Treatment:                                                DATE: 02/05/2024  Therapeutic Exercise: PROM of L knee into flex/ext, gentle overpressure AROM  L knee flex/ext  2x12 , 3s hold (0-120) SLR 3x12.2s hold  Therapeutic Activity: UE assist lunges 2x12, 3s 60-120 ROM only UE supported squat isometric  2x6, 10s hold, @90d  knee flexion    [facility] Adult PT Treatment:                                                DATE:  02/03/2024  Therapeutic Exercise: PROM of L knee into flex/ext, gentle overpressure AROM  L knee flex/ext  2x12 (0-120)  Therapeutic Activity: UE supported squat isometric  2x6, 10s hold, @90d  knee flexion Seated marches, RTB 2x10, 3s hold POC discussion, 4 week transition phase   PATIENT EDUCATION:  Education details: Pt received education regarding HEP performance, ADL performance, functional activity tolerance, impairment education, appropriate performance of therapeutic activities. Surgery methodology, surgical precautions, surgical protocol. Person educated: Patient Education method: Explanation, Demonstration, Tactile cues, Verbal cues, and Handouts Education comprehension: verbalized understanding and returned demonstration  HOME EXERCISE PROGRAM: Access Code: [email or web address] URL: [email or web address] Date: 02/10/2024 Prepared by: Sheliah Plane  Exercises - Standing Isometric Hip Abduction with Knee at 90 at Wall, bent knee  - 1 x daily - 5-7 x weekly - 3 sets - 8 reps - 8s hold - Wall Squat  - 1 x daily - 5-7 x weekly - 3 sets - 8 reps - 10s hold - Forward Step Down  - 1 x daily - 5-7 x weekly - 3 sets - 10 reps - Backward Step Down  - 1 x daily - 5-7 x weekly - 3 sets - 10 reps  ASSESSMENT:  CLINICAL IMPRESSION: Pt attended physical therapy session for continuation of treatment regarding Left knee arthroscopy with MPFL reconstruction and trochleoplasty on 01/08/2024.. Today's treatment focused on improvement of  quadriceps recruitment in stance, functional strengthening for stair management, and knee stability. Pt showed  great tolerance to treatment and demonstrated improvement with all aspects of focus. Some difficulties with maintaining a bent knee during closed chain extension. Pt required moderate verbal/tactile cuing alongside no physical assistance for safe and appropriate performance of today's activities. Follow up on HEP next visit, continue with  therapeutic focus on knee stability, global hip/knee strengthening, and quadriceps recruitment in stance, continue progressing within POC and surgical protocol as tolerated.    Eval impression (01/20/2024): Pt. attended today's physical therapy session for evaluation of Left knee arthroscopy with MPFL reconstruction and trochleoplasty on 01/08/2024. Pt currently does not have pain however mentioned having most difficulties with dressing, ambulation, and showering. Pt has notable deficits with knee rom, quadriceps activation, and functional ability with ADLs. Pt would benefit from therapeutic focus on quadriceps activation and ROM work, progressing within surgical protocol, laid out well on page 3 of referral found in the media tab.  Treatment performed today focused on patient education detailed in obj. Pt demonstrated great understanding of education provided. required minimal cues and no assistance for appropriate performance with today's activities. Pt requires the intervention of skilled outpatient physical therapy to address the aforementioned deficits and progress towards a functional level in line with therapeutic goals.   OBJECTIVE IMPAIRMENTS: Abnormal gait, decreased balance, decreased endurance, decreased knowledge of condition, decreased mobility, difficulty walking, decreased ROM, decreased strength, and increased edema.   ACTIVITY LIMITATIONS: carrying, lifting, bending, squatting, sleeping, stairs, bathing, dressing, and locomotion level  PARTICIPATION LIMITATIONS: interpersonal relationship, shopping, community activity, occupation, and school  REHAB POTENTIAL: Excellent  CLINICAL DECISION MAKING: Stable/uncomplicated  EVALUATION COMPLEXITY: Low   GOALS: Goals reviewed with patient? Yes  SHORT TERM GOALS: Target date: 03/02/2024 Pt will be independent with administered HEP to demonstrate the competency necessary for long term managemnet of symptoms at home.  Baseline: Goal  status: INITIAL  2.  Pt will be able to perform a straight leg raise with </= 3d of quadriceps lag to demonstrate improved quadriceps motor recruitment during activity. Baseline: unable Goal status: INITIAL  3.  Pt will demonstrate active knee flexion to 90d to demonstrate improvement in ROM necessary for daily function and staying on  track with surgical protocol. Baseline: 30d Goal status: INITIAL   LONG TERM GOALS: Target date: 04/13/2024   Pt. Will achieve a LEFS score of at least 60/80 as to demonstrate improvement in self-perceived functional ability with daily activities.  Baseline: 24/80 Goal status: INITIAL  2.  Pt will demonstrate 0-120d of Active knee ROM to demonstrate improved quality of movement and mobility neccessary for all ADLs and community participation in recreational activities. Baseline:  Goal status: INITIAL  3.  Pt will demonstrate global hip/knee strength at least 4+/5 to demonstrate improved motor recruitment, strength necessary for quality movement, and performance of functional activities such as prolonged walking and running. Baseline: Not indicated at baseline, assumed <3 d/t surgical precautions Goal status: INITIAL   PLAN:  PT FREQUENCY: 2x/week  PT DURATION: 12 weeks  PLANNED INTERVENTIONS: 97110-Therapeutic exercises, 97530- Therapeutic activity, [id number]- Neuromuscular re-education, 97535- Self Care, [id number]- Manual therapy, [id number]- Gait training, [phone]- Electrical stimulation (manual), [phone]- Wound care (first 20 sq cm), Patient/Family education, Balance training, Stair training, Joint mobilization, Scar mobilization, Compression bandaging, and DME instructions  PLAN FOR NEXT SESSION:  Follow up on HEP next visit, continue with therapeutic focus on knee stability, global hip/knee strengthening, and quadriceps recruitment in stance, continue progressing within POC and surgical protocol as tolerated.   Sheliah Plane, PT, DPT 02/10/2024, 2:44  PM

## 2024-02-12 ENCOUNTER — Ambulatory Visit: Admitting: Physical Therapy

## 2024-02-17 ENCOUNTER — Encounter: Admitting: Physical Therapy

## 2024-02-19 ENCOUNTER — Ambulatory Visit: Admitting: Physical Therapy

## 2024-02-24 ENCOUNTER — Encounter: Payer: Self-pay | Admitting: Physical Therapy

## 2024-02-24 ENCOUNTER — Ambulatory Visit: Attending: Orthopaedic Surgery | Admitting: Physical Therapy

## 2024-02-24 DIAGNOSIS — M25662 Stiffness of left knee, not elsewhere classified: Secondary | ICD-10-CM | POA: Insufficient documentation

## 2024-02-24 DIAGNOSIS — R2689 Other abnormalities of gait and mobility: Secondary | ICD-10-CM | POA: Insufficient documentation

## 2024-02-24 DIAGNOSIS — M6281 Muscle weakness (generalized): Secondary | ICD-10-CM | POA: Insufficient documentation

## 2024-02-24 NOTE — Therapy (Signed)
 OUTPATIENT PHYSICAL THERAPY TREATMENT NOTE   Patient Name: Daniel Gregory MRN: [id number] DOB:October 30, 2001, 23 y.o., male Today's Date: 02/24/2024  END OF SESSION:  PT End of Session - 02/24/24 1213     Visit Number 6    Number of Visits 24    Date for PT Re-Evaluation 04/13/24    Authorization Type MCD    Authorization Time Period 8 visits approved 01/20/24-03/20/24    Authorization - Visit Number 5    Authorization - Number of Visits 8    PT Start Time 1215    PT Stop Time 1255    PT Time Calculation (min) 40 min    Activity Tolerance Patient tolerated treatment well    Behavior During Therapy [facility] for tasks assessed/performed                 Past Medical History:  Diagnosis Date   History of kidney stones    Past Surgical History:  Procedure Laterality Date   KNEE ARTHROSCOPY Right 2017   KNEE RECONSTRUCTION Left 01/08/2024   Procedure: KNEE RECONSTRUCTION;  Surgeon: Bjorn Pippin, MD;  Location: [facility];  Service: Orthopedics;  Laterality: Left;   OSTEOCHONDRAL DEFECT REPAIR/RECONSTRUCTION Left 01/08/2024   Procedure: OSTEOCHONDRAL DEFECT REPAIR/RECONSTRUCTION;  Surgeon: Bjorn Pippin, MD;  Location: [facility];  Service: Orthopedics;  Laterality: Left;   SYNOVECTOMY Left 01/08/2024   Procedure: SYNOVECTOMY;  Surgeon: Bjorn Pippin, MD;  Location: [facility];  Service: Orthopedics;  Laterality: Left;   TIBIA OSTEOTOMY Left 01/08/2024   Procedure: FEMUR OSTEOTOMY;  Surgeon: Bjorn Pippin, MD;  Location: [facility];  Service: Orthopedics;  Laterality: Left;   TONSILLECTOMY     UPPER GI ENDOSCOPY N/A 10/16/2021   Procedure: UPPER GI ENDOSCOPY;  Surgeon: Luretha Murphy, MD;  Location: [facility];  Service: General;  Laterality: N/A;   Patient Active Problem List   Diagnosis Date Noted   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   Abdominal pain     PCP: Gordan Payment, MD  REFERRING PROVIDER: Bjorn Pippin, MD  REFERRING DIAG: Left knee arthroscopy with MPFL reconstruction and trochleoplasty 2/20   THERAPY DIAG:  Stiffness of left knee, not elsewhere classified  Muscle weakness (generalized)  Other abnormalities of gait and mobility  Rationale for Evaluation and Treatment: Rehabilitation   ONSET DATE: 01/08/2024  Next MD appt: End of march  SUBJECTIVE:   SUBJECTIVE STATEMENT: Pt stated that he's feeling good overall, no issues with buckling recently, still has some slight knee pain randomly when he bends it, improving over time though.  Eval statement: Pt stated that he's about 2 weeks out from surgery and has been feeling pretty good, no real pain, just some difficulties with things like putting pants on or getting into the shower.  PERTINENT HISTORY: Surgery was 01/08/2024 PAIN:  Are you having pain? No  PRECAUTIONS: Other: check media referral, page 3 for surgical protocol.  Currently in phase 1  Keep brace locked in extension outside of therapy, avoid active knee extension, active knee flexion as tolerated  RED FLAGS: None   WEIGHT BEARING RESTRICTIONS: Yes WBAT with brace locked in extenstion  Full WB w/o brace as of 02/05/2024  FALLS:  Has patient fallen in last 6 months? No  LIVING ENVIRONMENT: Lives with: lives with their family Lives in: House/apartment Has following equipment at home: None  PLOF: Independent  PATIENT GOALS: "just get better"  NEXT MD VISIT: 3 week follow  up from surgery  OP Date: 01/08/2024  2 weeks 01/22/2024  4 weeks 02/05/2024  6 weeks 02/19/2024  8 weeks 03/04/2024  10 weeks 03/18/2024  12 weeks 04/01/2024    OBJECTIVE:  Note: Objective measures were completed at Evaluation unless otherwise noted.  PATIENT SURVEYS:  LEFS 24/80  COGNITION: Overall cognitive status: Within functional limits for tasks assessed     SENSATION: WFL  EDEMA:  Moderate edema In L knee  MUSCLE LENGTH: Not tested d/t surgical precautions  POSTURE:  No Significant postural limitations  PALPATION: Tenderness around surgical site  LOWER EXTREMITY ROM:  Passive ROM Right eval Left eval Left 01/27/24  Hip flexion [facility]   Hip extension [facility]   Hip abduction     Hip adduction     Hip internal rotation     Hip external rotation     Knee flexion WFL 30d 56  Knee extension 0 0   Ankle dorsiflexion     Ankle plantarflexion     Ankle inversion     Ankle eversion      (Blank rows = not tested)  ! Indicates pain with testing  LOWER EXTREMITY MMT: NI d/t surgical precautions  MMT Right eval Left eval  Hip flexion    Hip extension    Hip abduction    Hip adduction    Hip internal rotation    Hip external rotation    Knee flexion    Knee extension    Ankle dorsiflexion    Ankle plantarflexion    Ankle inversion    Ankle eversion     (Blank rows = not tested)  ! Indicates pain with testing  LOWER EXTREMITY SPECIAL TESTS:  Not indicated d/t surgery  FUNCTIONAL TESTS:  Not indicated  GAIT: Distance walked: 137ft Assistive device utilized:  Brace locked in extension on LLE Level of assistance: Complete Independence   [facility] Adult PT Treatment:                                                DATE: 02/24/2024  Therapeutic Activity: Monster walk 2x60ft, RTB Fwd/lat Step ups 2x12, LLE lead, 6" box  w/compliant surface Bent knee SLS on sissel bosu 2x2' Bent to 40d of knee flexion Partial Squat no UE assist 2x10, 3s hold  Adduction with BTB 2x12,4s hold, seated CKC, knee bent to 40d of flexion  [facility] Adult PT Treatment:                                                DATE: 02/10/2024  Therapeutic Exercise: PROM of L knee into flex/ext, gentle overpressure Reached 0-130 actively following PROM Therapeutic Activity: UE assist elevated lunges 2x12, 3s 60-120 ROM only UE supported squat isometric  2x8, 5s hold, @100d  knee flexion Step down onto sissel bosu 2x6, 5s hold on bosu POC discussion,  HEP     PATIENT EDUCATION:  Education details: Pt received education regarding HEP performance, ADL performance, functional activity tolerance, impairment education, appropriate performance of therapeutic activities. Surgery methodology, surgical precautions, surgical protocol. Person educated: Patient Education method: Explanation, Demonstration, Tactile cues, Verbal cues, and Handouts Education comprehension: verbalized understanding and returned demonstration  HOME EXERCISE PROGRAM: Access Code: [email or web address] URL: [email or web address] Date:  02/10/2024 Prepared by: Sheliah Plane  Exercises - Standing Isometric Hip Abduction with Knee at 90 at Wall, bent knee  - 1 x daily - 5-7 x weekly - 3 sets - 8 reps - 8s hold - Wall Squat  - 1 x daily - 5-7 x weekly - 3 sets - 8 reps - 10s hold - Forward Step Down  - 1 x daily - 5-7 x weekly - 3 sets - 10 reps - Backward Step Down  - 1 x daily - 5-7 x weekly - 3 sets - 10 reps  ASSESSMENT:  CLINICAL IMPRESSION: Pt attended physical therapy session for continuation of treatment regarding Left knee arthroscopy with MPFL reconstruction and trochleoplasty on 01/08/2024.. Today's treatment focused on improvement of  quadriceps recruitment, knee stability, global/hip knee strength, and single leg stance tolerance maintaining guidelines of surgical protocol. Pt showed  great tolerance to treatment and demonstrated improvement with all treatment focus points. Some difficulties continue with. Pt required moderate verbal/tactile cuing alongside stand by assistance for safe and appropriate performance of today's activities while learning new techniques such as an appropriate squat. Continue with therapeutic focus on knee stability, global hip/knee strengthening, and quadriceps recruitment in stance, continue progressing within POC and surgical protocol as tolerated..     Eval impression (01/20/2024): Pt. attended today's physical therapy session for  evaluation of Left knee arthroscopy with MPFL reconstruction and trochleoplasty on 01/08/2024. Pt currently does not have pain however mentioned having most difficulties with dressing, ambulation, and showering. Pt has notable deficits with knee rom, quadriceps activation, and functional ability with ADLs. Pt would benefit from therapeutic focus on quadriceps activation and ROM work, progressing within surgical protocol, laid out well on page 3 of referral found in the media tab.  Treatment performed today focused on patient education detailed in obj. Pt demonstrated great understanding of education provided. required minimal cues and no assistance for appropriate performance with today's activities. Pt requires the intervention of skilled outpatient physical therapy to address the aforementioned deficits and progress towards a functional level in line with therapeutic goals.   OBJECTIVE IMPAIRMENTS: Abnormal gait, decreased balance, decreased endurance, decreased knowledge of condition, decreased mobility, difficulty walking, decreased ROM, decreased strength, and increased edema.   ACTIVITY LIMITATIONS: carrying, lifting, bending, squatting, sleeping, stairs, bathing, dressing, and locomotion level  PARTICIPATION LIMITATIONS: interpersonal relationship, shopping, community activity, occupation, and school  REHAB POTENTIAL: Excellent  CLINICAL DECISION MAKING: Stable/uncomplicated  EVALUATION COMPLEXITY: Low   GOALS: Goals reviewed with patient? Yes  SHORT TERM GOALS: Target date: 03/02/2024 Pt will be independent with administered HEP to demonstrate the competency necessary for long term managemnet of symptoms at home.  Baseline: Goal status: INITIAL  2.  Pt will be able to perform a straight leg raise with </= 3d of quadriceps lag to demonstrate improved quadriceps motor recruitment during activity. Baseline: unable Goal status: INITIAL  3.  Pt will demonstrate active knee flexion to  90d to demonstrate improvement in ROM necessary for daily function and staying on track with surgical protocol. Baseline: 30d Goal status: INITIAL   LONG TERM GOALS: Target date: 04/13/2024   Pt. Will achieve a LEFS score of at least 60/80 as to demonstrate improvement in self-perceived functional ability with daily activities.  Baseline: 24/80 Goal status: INITIAL  2.  Pt will demonstrate 0-120d of Active knee ROM to demonstrate improved quality of movement and mobility neccessary for all ADLs and community participation in recreational activities. Baseline:  Goal status: INITIAL  3.  Pt  will demonstrate global hip/knee strength at least 4+/5 to demonstrate improved motor recruitment, strength necessary for quality movement, and performance of functional activities such as prolonged walking and running. Baseline: Not indicated at baseline, assumed <3 d/t surgical precautions Goal status: INITIAL   PLAN:  PT FREQUENCY: 2x/week  PT DURATION: 12 weeks  PLANNED INTERVENTIONS: 97110-Therapeutic exercises, 97530- Therapeutic activity, [id number]- Neuromuscular re-education, 97535- Self Care, 16109- Manual therapy, [id number]- Gait training, [phone]- Electrical stimulation (manual), [phone]- Wound care (first 20 sq cm), Patient/Family education, Balance training, Stair training, Joint mobilization, Scar mobilization, Compression bandaging, and DME instructions  PLAN FOR NEXT SESSION:  Continue with therapeutic focus on knee stability, global hip/knee strengthening, and quadriceps recruitment in stance, continue progressing within POC and surgical protocol as tolerated.Sheliah Plane, PT, DPT 02/24/2024, 12:57 PM

## 2024-03-02 ENCOUNTER — Ambulatory Visit: Admitting: Physical Therapy

## 2024-03-09 ENCOUNTER — Ambulatory Visit: Admitting: Physical Therapy

## 2024-03-16 ENCOUNTER — Encounter: Payer: Self-pay | Admitting: Physical Therapy

## 2024-03-16 ENCOUNTER — Ambulatory Visit: Admitting: Physical Therapy

## 2024-03-16 DIAGNOSIS — M25662 Stiffness of left knee, not elsewhere classified: Secondary | ICD-10-CM

## 2024-03-16 DIAGNOSIS — R2689 Other abnormalities of gait and mobility: Secondary | ICD-10-CM

## 2024-03-16 DIAGNOSIS — M6281 Muscle weakness (generalized): Secondary | ICD-10-CM

## 2024-03-16 NOTE — Therapy (Addendum)
 OUTPATIENT PHYSICAL THERAPY TREATMENT NOTE   Patient Name: Daniel Gregory MRN: [id number] DOB:[birth year], 23 y.o., male Today's Date: 03/16/2024   PHYSICAL THERAPY DISCHARGE SUMMARY  Visits from Start of Care: 7  Current functional level related to goals / functional outcomes: see assessment   Remaining deficits: see assessment   Education / Equipment: see assessment   Patient agrees to discharge. Patient goals were partially met. Patient is being discharged due to not returning since the last visit.  Albesa Huguenin, PT, DPT 04/22/2024, 10:59 AM    END OF SESSION:  PT End of Session - 03/16/24 1214     Visit Number 7    Number of Visits 24    Date for PT Re-Evaluation 04/13/24    Authorization - Visit Number 6    Authorization - Number of Visits 8    PT Start Time 1215    PT Stop Time 1253    PT Time Calculation (min) 38 min    Activity Tolerance Patient tolerated treatment well    Behavior During Therapy WFL for tasks assessed/performed                 Past Medical History:  Diagnosis Date   History of kidney stones    Past Surgical History:  Procedure Laterality Date   KNEE ARTHROSCOPY Right 2017   KNEE RECONSTRUCTION Left 01/08/2024   Procedure: KNEE RECONSTRUCTION;  Surgeon: Micheline Ahr, MD;  Location: [facility];  Service: Orthopedics;  Laterality: Left;   OSTEOCHONDRAL DEFECT REPAIR/RECONSTRUCTION Left 01/08/2024   Procedure: OSTEOCHONDRAL DEFECT REPAIR/RECONSTRUCTION;  Surgeon: Micheline Ahr, MD;  Location: [facility];  Service: Orthopedics;  Laterality: Left;   SYNOVECTOMY Left 01/08/2024   Procedure: SYNOVECTOMY;  Surgeon: Micheline Ahr, MD;  Location: [facility];  Service: Orthopedics;  Laterality: Left;   TIBIA OSTEOTOMY Left 01/08/2024   Procedure: FEMUR OSTEOTOMY;  Surgeon: Micheline Ahr, MD;  Location: [facility];  Service: Orthopedics;  Laterality: Left;   TONSILLECTOMY     UPPER  GI ENDOSCOPY N/A 10/16/2021   Procedure: UPPER GI ENDOSCOPY;  Surgeon: Jacolyn Matar, MD;  Location: [facility];  Service: General;  Laterality: N/A;   Patient Active Problem List   Diagnosis Date Noted   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   Abdominal pain     PCP: Abbe Hoard, MD  REFERRING PROVIDER: Micheline Ahr, MD  REFERRING DIAG: Left knee arthroscopy with MPFL reconstruction and trochleoplasty 2/20   THERAPY DIAG:  Stiffness of left knee, not elsewhere classified  Muscle weakness (generalized)  Other abnormalities of gait and mobility  Rationale for Evaluation and Treatment: Rehabilitation   ONSET DATE: 01/08/2024  Next MD appt: End of march  SUBJECTIVE:   SUBJECTIVE STATEMENT: Pt stated that he's feeling good overall, no issues with buckling recently, still has some slight knee pain randomly when he bends it, improving over time though.  Eval statement: Pt stated that he's about 2 weeks out from surgery and has been feeling pretty good, no real pain, just some difficulties with things like putting pants on or getting into the shower.  PERTINENT HISTORY: Surgery was 01/08/2024 PAIN:  Are you having pain? No  PRECAUTIONS: Other: check media referral, page 3 for surgical protocol.  Currently in phase 1  Keep brace locked in extension outside of therapy, avoid active knee extension, active knee flexion as tolerated  RED FLAGS: None   WEIGHT BEARING RESTRICTIONS: Yes WBAT with brace locked  in extenstion Full WB w/o brace as of 02/05/2024  FALLS:  Has patient fallen in last 6 months? No  LIVING ENVIRONMENT: Lives with: lives with their family Lives in: House/apartment Has following equipment at home: None  PLOF: Independent  PATIENT GOALS: "just get better"  NEXT MD VISIT: 3 week follow up from surgery  OP Date: 01/08/2024  2 weeks 01/22/2024  4 weeks 02/05/2024  6 weeks 02/19/2024  8 weeks 03/04/2024  10 weeks 03/18/2024  12 weeks 04/01/2024     OBJECTIVE:  Note: Objective measures were completed at Evaluation unless otherwise noted.  PATIENT SURVEYS:  LEFS 24/80  COGNITION: Overall cognitive status: Within functional limits for tasks assessed     SENSATION: WFL  EDEMA:  Moderate edema In L knee  MUSCLE LENGTH: Not tested d/t surgical precautions  POSTURE: No Significant postural limitations  PALPATION: Tenderness around surgical site  LOWER EXTREMITY ROM:  Passive ROM Right eval Left eval Left 01/27/24  Hip flexion [facility]   Hip extension [facility]   Hip abduction     Hip adduction     Hip internal rotation     Hip external rotation     Knee flexion WFL 30d 56  Knee extension 0 0   Ankle dorsiflexion     Ankle plantarflexion     Ankle inversion     Ankle eversion      (Blank rows = not tested)  ! Indicates pain with testing  LOWER EXTREMITY MMT: NI d/t surgical precautions  MMT Right eval Left eval  Hip flexion    Hip extension    Hip abduction    Hip adduction    Hip internal rotation    Hip external rotation    Knee flexion    Knee extension    Ankle dorsiflexion    Ankle plantarflexion    Ankle inversion    Ankle eversion     (Blank rows = not tested)  ! Indicates pain with testing  LOWER EXTREMITY SPECIAL TESTS:  Not indicated d/t surgery  FUNCTIONAL TESTS:  Not indicated  GAIT: Distance walked: 137ft Assistive device utilized: Brace locked in extension on LLE Level of assistance: Complete Independence  [facility] Adult PT Treatment:                                                DATE: 03/16/2024 Therapeutic Activity: NuStep 5' SL Leg press  3x10, 35lbs,  Use bolster to have pt sit further forward and promote more knee flexion Straight leg deadlift 2x12, (bent knee about 10d) , 25lb minibarbell Lunge 3x8 B, 3s hold single UE assist PRN Education for at home management, introduction on additional activities for HEP    [facility] Adult PT Treatment:                                                 DATE: 02/24/2024  Therapeutic Activity: Monster walk 2x87ft, RTB Fwd/lat Step ups 2x12, LLE lead, 6" box  w/compliant surface Bent knee SLS on sissel bosu 2x2' Bent to 40d of knee flexion Partial Squat no UE assist 2x10, 3s hold  Adduction with BTB 2x12,4s hold, seated CKC, knee bent to 40d of flexion  [facility] Adult PT Treatment:  DATE: 02/10/2024  Therapeutic Exercise: PROM of L knee into flex/ext, gentle overpressure Reached 0-130 actively following PROM Therapeutic Activity: UE assist elevated lunges 2x12, 3s 60-120 ROM only UE supported squat isometric  2x8, 5s hold, @100d  knee flexion Step down onto sissel bosu 2x6, 5s hold on bosu POC discussion, HEP     PATIENT EDUCATION:  Education details: Pt received education regarding HEP performance, ADL performance, functional activity tolerance, impairment education, appropriate performance of therapeutic activities. Surgery methodology, surgical precautions, surgical protocol. Person educated: Patient Education method: Explanation, Demonstration, Tactile cues, Verbal cues, and Handouts Education comprehension: verbalized understanding and returned demonstration  HOME EXERCISE PROGRAM: Access Code: [email or web address] URL: [email or web address] Date: 02/10/2024 Prepared by: Albesa Huguenin  Exercises - Standing Isometric Hip Abduction with Knee at 90 at Wall, bent knee  - 1 x daily - 5-7 x weekly - 3 sets - 8 reps - 8s hold - Wall Squat  - 1 x daily - 5-7 x weekly - 3 sets - 8 reps - 10s hold - Forward Step Down  - 1 x daily - 5-7 x weekly - 3 sets - 10 reps - Backward Step Down  - 1 x daily - 5-7 x weekly - 3 sets - 10 reps  ASSESSMENT:  CLINICAL IMPRESSION: Pt attended physical therapy session for continuation of treatment regarding L knee MPFL reconstruction. Today's treatment focused on improvement of  quad recruitment in stance, global hip/knee  strength stability, and functional strengthening with daily activities such as kneeling and lifting objects from the floor. Pt showed  great tolerance to administered treatment with no adverse effects by the end of session. Pt required moderate verbal/tactile cuing alongside no physical assistance for safe and appropriate performance of today's activities. Continue with therapeutic focus on quad recruitment in stance, global hip/knee strength stability, and functional. Progress within surgical protocol as tolerated    Eval impression (01/20/2024): Pt. attended today's physical therapy session for evaluation of Left knee arthroscopy with MPFL reconstruction and trochleoplasty on 01/08/2024. Pt currently does not have pain however mentioned having most difficulties with dressing, ambulation, and showering. Pt has notable deficits with knee rom, quadriceps activation, and functional ability with ADLs. Pt would benefit from therapeutic focus on quadriceps activation and ROM work, progressing within surgical protocol, laid out well on page 3 of referral found in the media tab.  Treatment performed today focused on patient education detailed in obj. Pt demonstrated great understanding of education provided. required minimal cues and no assistance for appropriate performance with today's activities. Pt requires the intervention of skilled outpatient physical therapy to address the aforementioned deficits and progress towards a functional level in line with therapeutic goals.   OBJECTIVE IMPAIRMENTS: Abnormal gait, decreased balance, decreased endurance, decreased knowledge of condition, decreased mobility, difficulty walking, decreased ROM, decreased strength, and increased edema.   ACTIVITY LIMITATIONS: carrying, lifting, bending, squatting, sleeping, stairs, bathing, dressing, and locomotion level  PARTICIPATION LIMITATIONS: interpersonal relationship, shopping, community activity, occupation, and  school  REHAB POTENTIAL: Excellent  CLINICAL DECISION MAKING: Stable/uncomplicated  EVALUATION COMPLEXITY: Low   GOALS: Goals reviewed with patient? Yes  SHORT TERM GOALS: Target date: 03/02/2024 Pt will be independent with administered HEP to demonstrate the competency necessary for long term managemnet of symptoms at home.  Baseline: Goal status: INITIAL  2.  Pt will be able to perform a straight leg raise with </= 3d of quadriceps lag to demonstrate improved quadriceps motor recruitment during activity. Baseline: unable Goal status: INITIAL  3.  Pt will demonstrate  active knee flexion to 90d to demonstrate improvement in ROM necessary for daily function and staying on track with surgical protocol. Baseline: 30d Goal status: INITIAL   LONG TERM GOALS: Target date: 04/13/2024   Pt. Will achieve a LEFS score of at least 60/80 as to demonstrate improvement in self-perceived functional ability with daily activities.  Baseline: 24/80 Goal status: INITIAL  2.  Pt will demonstrate 0-120d of Active knee ROM to demonstrate improved quality of movement and mobility neccessary for all ADLs and community participation in recreational activities. Baseline:  Goal status: INITIAL  3.  Pt will demonstrate global hip/knee strength at least 4+/5 to demonstrate improved motor recruitment, strength necessary for quality movement, and performance of functional activities such as prolonged walking and running. Baseline: Not indicated at baseline, assumed <3 d/t surgical precautions Goal status: INITIAL   PLAN:  PT FREQUENCY: 2x/week  PT DURATION: 12 weeks  PLANNED INTERVENTIONS: 97110-Therapeutic exercises, 97530- Therapeutic activity, [id number]- Neuromuscular re-education, 97535- Self Care, 16109- Manual therapy, [id number]- Gait training, [phone]- Electrical stimulation (manual), [phone]- Wound care (first 20 sq cm), Patient/Family education, Balance training, Stair training, Joint mobilization, Scar  mobilization, Compression bandaging, and DME instructions  PLAN FOR NEXT SESSION:  Continue with therapeutic focus on knee stability, global hip/knee strengthening, and quadriceps recruitment in stance, continue progressing within POC and surgical protocol as tolerated.Albesa Huguenin, PT, DPT 03/16/2024, 12:53 PM

## 2024-03-30 ENCOUNTER — Ambulatory Visit: Admitting: Physical Therapy

## 2024-05-20 ENCOUNTER — Encounter (HOSPITAL_COMMUNITY): Payer: Self-pay | Admitting: *Deleted
